# Patient Record
Sex: Male | Born: 1945 | Race: White | Hispanic: No | Marital: Married | Smoking: Former smoker
Health system: Southern US, Community
[De-identification: ages and names within clinical notes are randomized; demographics above are authoritative.]

## PROBLEM LIST (undated history)

## (undated) DIAGNOSIS — I2510 Atherosclerotic heart disease of native coronary artery without angina pectoris: Secondary | ICD-10-CM

## (undated) DIAGNOSIS — I10 Essential (primary) hypertension: Secondary | ICD-10-CM

## (undated) DIAGNOSIS — R001 Bradycardia, unspecified: Secondary | ICD-10-CM

---

## 2011-08-05 DIAGNOSIS — I2510 Atherosclerotic heart disease of native coronary artery without angina pectoris: Secondary | ICD-10-CM

## 2011-08-07 HISTORY — PX: CORONARY ARTERY BYPASS GRAFT: SHX141

## 2011-08-09 DIAGNOSIS — E1165 Type 2 diabetes mellitus with hyperglycemia: Secondary | ICD-10-CM

## 2011-08-09 DIAGNOSIS — IMO0001 Reserved for inherently not codable concepts without codable children: Secondary | ICD-10-CM

## 2011-09-18 ENCOUNTER — Encounter: Payer: Self-pay | Admitting: Cardiothoracic Surgery

## 2011-09-18 ENCOUNTER — Ambulatory Visit (INDEPENDENT_AMBULATORY_CARE_PROVIDER_SITE_OTHER): Payer: Self-pay | Admitting: Physician Assistant

## 2011-09-18 VITALS — BP 124/86 | HR 84 | Resp 18

## 2011-09-18 DIAGNOSIS — Z951 Presence of aortocoronary bypass graft: Secondary | ICD-10-CM

## 2011-09-18 NOTE — Progress Notes (Signed)
Samuel Houston return for scheduled followup after four-vessel coronary bypass grafting on 08/07/2011. His postoperative course was uneventful. He is continuing to make satisfactory recovery after his discharge home. He is a newly diagnosed diabetic and has been taking metformin 1 g twice daily. He didn't close watch on his glucose at home and his glucoses ranging around 140-160. He denies any problems since his discharge home. He denies any chest pain or dyspnea. 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 He is pleased with his progress. His medications are reviewed and include aspirin 81 mg by mouth daily Lipitor 40 mg by mouth bedtime Lopressor 12.5 mg by mouth twice a day metformin 1000 mg by mouth twice a day and lisinopril 2.5 mg by mouth daily.  On exam his blood pressure is 124/86 his pulse is 84 respirations are 18. His heart is in a regular rate and rhythm. His breath sounds are clear to auscultation. The sternum is stable. He has no peripheral edema. Sutures are removed from the chest tube sites as well as the saphenous harvest site was left leg.  Sternal cautions are reviewed. Samuel Houston understands he may return to his regular work activities without limitations about 6 weeks from now. No further followup is scheduled in our office but he understands he may contact us at any time should any problems arise related to his surgery.

## 2011-10-05 ENCOUNTER — Other Ambulatory Visit: Payer: Self-pay | Admitting: Surgical

## 2012-07-20 ENCOUNTER — Other Ambulatory Visit: Payer: Self-pay | Admitting: Surgical

## 2013-07-02 DIAGNOSIS — E1159 Type 2 diabetes mellitus with other circulatory complications: Secondary | ICD-10-CM | POA: Insufficient documentation

## 2013-12-17 DIAGNOSIS — I10 Essential (primary) hypertension: Secondary | ICD-10-CM | POA: Insufficient documentation

## 2013-12-17 DIAGNOSIS — E785 Hyperlipidemia, unspecified: Secondary | ICD-10-CM | POA: Insufficient documentation

## 2015-09-11 DIAGNOSIS — I2510 Atherosclerotic heart disease of native coronary artery without angina pectoris: Secondary | ICD-10-CM | POA: Diagnosis present

## 2015-09-11 DIAGNOSIS — Z951 Presence of aortocoronary bypass graft: Secondary | ICD-10-CM | POA: Insufficient documentation

## 2015-09-12 DIAGNOSIS — D509 Iron deficiency anemia, unspecified: Secondary | ICD-10-CM | POA: Insufficient documentation

## 2015-09-12 DIAGNOSIS — I252 Old myocardial infarction: Secondary | ICD-10-CM | POA: Insufficient documentation

## 2016-04-25 ENCOUNTER — Inpatient Hospital Stay (HOSPITAL_COMMUNITY)
Admission: EM | Admit: 2016-04-25 | Discharge: 2016-04-27 | DRG: 244 | Disposition: A | Discharge status: Home / Self Care | Payer: Medicare HMO | Attending: Cardiovascular Disease | Admitting: Cardiovascular Disease

## 2016-04-25 ENCOUNTER — Inpatient Hospital Stay (HOSPITAL_COMMUNITY): Payer: Medicare HMO

## 2016-04-25 ENCOUNTER — Encounter (HOSPITAL_COMMUNITY): Payer: Self-pay | Admitting: Emergency Medicine

## 2016-04-25 ENCOUNTER — Emergency Department (HOSPITAL_COMMUNITY): Payer: Medicare HMO

## 2016-04-25 DIAGNOSIS — Z95818 Presence of other cardiac implants and grafts: Secondary | ICD-10-CM

## 2016-04-25 DIAGNOSIS — R55 Syncope and collapse: Secondary | ICD-10-CM | POA: Diagnosis present

## 2016-04-25 DIAGNOSIS — Z6827 Body mass index (BMI) 27.0-27.9, adult: Secondary | ICD-10-CM

## 2016-04-25 DIAGNOSIS — Z881 Allergy status to other antibiotic agents status: Secondary | ICD-10-CM

## 2016-04-25 DIAGNOSIS — R42 Dizziness and giddiness: Secondary | ICD-10-CM

## 2016-04-25 DIAGNOSIS — Z79899 Other long term (current) drug therapy: Secondary | ICD-10-CM

## 2016-04-25 DIAGNOSIS — Z951 Presence of aortocoronary bypass graft: Secondary | ICD-10-CM | POA: Diagnosis not present

## 2016-04-25 DIAGNOSIS — Z87891 Personal history of nicotine dependence: Secondary | ICD-10-CM

## 2016-04-25 DIAGNOSIS — Z888 Allergy status to other drugs, medicaments and biological substances status: Secondary | ICD-10-CM | POA: Diagnosis not present

## 2016-04-25 DIAGNOSIS — I252 Old myocardial infarction: Secondary | ICD-10-CM | POA: Diagnosis not present

## 2016-04-25 DIAGNOSIS — R4781 Slurred speech: Secondary | ICD-10-CM | POA: Diagnosis present

## 2016-04-25 DIAGNOSIS — I2510 Atherosclerotic heart disease of native coronary artery without angina pectoris: Secondary | ICD-10-CM | POA: Diagnosis present

## 2016-04-25 DIAGNOSIS — Z7982 Long term (current) use of aspirin: Secondary | ICD-10-CM | POA: Diagnosis not present

## 2016-04-25 DIAGNOSIS — I25111 Atherosclerotic heart disease of native coronary artery with angina pectoris with documented spasm: Secondary | ICD-10-CM

## 2016-04-25 DIAGNOSIS — E785 Hyperlipidemia, unspecified: Secondary | ICD-10-CM | POA: Diagnosis present

## 2016-04-25 DIAGNOSIS — I6521 Occlusion and stenosis of right carotid artery: Secondary | ICD-10-CM | POA: Diagnosis present

## 2016-04-25 DIAGNOSIS — I495 Sick sinus syndrome: Secondary | ICD-10-CM | POA: Diagnosis not present

## 2016-04-25 DIAGNOSIS — I25118 Atherosclerotic heart disease of native coronary artery with other forms of angina pectoris: Secondary | ICD-10-CM | POA: Diagnosis present

## 2016-04-25 DIAGNOSIS — R51 Headache: Secondary | ICD-10-CM | POA: Diagnosis present

## 2016-04-25 DIAGNOSIS — I10 Essential (primary) hypertension: Secondary | ICD-10-CM | POA: Diagnosis present

## 2016-04-25 DIAGNOSIS — R001 Bradycardia, unspecified: Secondary | ICD-10-CM | POA: Diagnosis present

## 2016-04-25 DIAGNOSIS — G459 Transient cerebral ischemic attack, unspecified: Secondary | ICD-10-CM | POA: Diagnosis not present

## 2016-04-25 DIAGNOSIS — I455 Other specified heart block: Secondary | ICD-10-CM | POA: Diagnosis present

## 2016-04-25 DIAGNOSIS — E119 Type 2 diabetes mellitus without complications: Secondary | ICD-10-CM | POA: Diagnosis present

## 2016-04-25 DIAGNOSIS — Z7984 Long term (current) use of oral hypoglycemic drugs: Secondary | ICD-10-CM | POA: Diagnosis not present

## 2016-04-25 DIAGNOSIS — Z794 Long term (current) use of insulin: Secondary | ICD-10-CM

## 2016-04-25 DIAGNOSIS — I4510 Unspecified right bundle-branch block: Secondary | ICD-10-CM | POA: Diagnosis present

## 2016-04-25 DIAGNOSIS — I6789 Other cerebrovascular disease: Secondary | ICD-10-CM | POA: Diagnosis not present

## 2016-04-25 DIAGNOSIS — E1159 Type 2 diabetes mellitus with other circulatory complications: Secondary | ICD-10-CM

## 2016-04-25 DIAGNOSIS — D508 Other iron deficiency anemias: Secondary | ICD-10-CM | POA: Diagnosis present

## 2016-04-25 HISTORY — DX: Bradycardia, unspecified: R00.1

## 2016-04-25 HISTORY — DX: Essential (primary) hypertension: I10

## 2016-04-25 HISTORY — DX: Atherosclerotic heart disease of native coronary artery without angina pectoris: I25.10

## 2016-04-25 LAB — PROTIME-INR
INR: 1.07
PROTHROMBIN TIME: 14 s (ref 11.4–15.2)

## 2016-04-25 LAB — RAPID URINE DRUG SCREEN, HOSP PERFORMED
AMPHETAMINES: NOT DETECTED
BARBITURATES: NOT DETECTED
Benzodiazepines: NOT DETECTED
COCAINE: NOT DETECTED
OPIATES: NOT DETECTED
TETRAHYDROCANNABINOL: NOT DETECTED

## 2016-04-25 LAB — DIFFERENTIAL
BASOS ABS: 0 10*3/uL (ref 0.0–0.1)
Basophils Relative: 0 %
EOS PCT: 1 %
Eosinophils Absolute: 0.1 10*3/uL (ref 0.0–0.7)
LYMPHS ABS: 1.8 10*3/uL (ref 0.7–4.0)
LYMPHS PCT: 25 %
Monocytes Absolute: 0.8 10*3/uL (ref 0.1–1.0)
Monocytes Relative: 11 %
NEUTROS PCT: 63 %
Neutro Abs: 4.6 10*3/uL (ref 1.7–7.7)

## 2016-04-25 LAB — CBC
HCT: 36.8 % — ABNORMAL LOW (ref 39.0–52.0)
HEMOGLOBIN: 11.8 g/dL — AB (ref 13.0–17.0)
MCH: 28.5 pg (ref 26.0–34.0)
MCHC: 32.1 g/dL (ref 30.0–36.0)
MCV: 88.9 fL (ref 78.0–100.0)
PLATELETS: 127 10*3/uL — AB (ref 150–400)
RBC: 4.14 MIL/uL — AB (ref 4.22–5.81)
RDW: 14.3 % (ref 11.5–15.5)
WBC: 7.3 10*3/uL (ref 4.0–10.5)

## 2016-04-25 LAB — COMPREHENSIVE METABOLIC PANEL
ALBUMIN: 3.7 g/dL (ref 3.5–5.0)
ALK PHOS: 21 U/L — AB (ref 38–126)
ALT: 57 U/L (ref 17–63)
ANION GAP: 12 (ref 5–15)
AST: 39 U/L (ref 15–41)
BUN: 16 mg/dL (ref 6–20)
CO2: 21 mmol/L — AB (ref 22–32)
Calcium: 9.3 mg/dL (ref 8.9–10.3)
Chloride: 102 mmol/L (ref 101–111)
Creatinine, Ser: 1.27 mg/dL — ABNORMAL HIGH (ref 0.61–1.24)
GFR calc Af Amer: >60 mL/min (ref >60)
GFR calc non Af Amer: 55 mL/min — ABNORMAL LOW (ref >60)
GLUCOSE: 280 mg/dL — AB (ref 65–99)
POTASSIUM: 4.4 mmol/L (ref 3.5–5.1)
SODIUM: 135 mmol/L (ref 135–145)
Total Bilirubin: 0.7 mg/dL (ref 0.3–1.2)
Total Protein: 6.7 g/dL (ref 6.5–8.1)

## 2016-04-25 LAB — URINALYSIS, ROUTINE W REFLEX MICROSCOPIC
BILIRUBIN URINE: NEGATIVE
HGB URINE DIPSTICK: NEGATIVE
KETONES UR: NEGATIVE mg/dL
Leukocytes, UA: NEGATIVE
Nitrite: NEGATIVE
PH: 6 (ref 5.0–8.0)
Protein, ur: 30 mg/dL — AB
Specific Gravity, Urine: 1.023 (ref 1.005–1.030)

## 2016-04-25 LAB — I-STAT CHEM 8, ED
BUN: 18 mg/dL (ref 6–20)
CHLORIDE: 102 mmol/L (ref 101–111)
CREATININE: 1.1 mg/dL (ref 0.61–1.24)
Calcium, Ion: 1.12 mmol/L (ref 1.12–1.23)
Glucose, Bld: 278 mg/dL — ABNORMAL HIGH (ref 65–99)
HEMATOCRIT: 37 % — AB (ref 39.0–52.0)
Hemoglobin: 12.6 g/dL — ABNORMAL LOW (ref 13.0–17.0)
POTASSIUM: 4.4 mmol/L (ref 3.5–5.1)
SODIUM: 135 mmol/L (ref 135–145)
TCO2: 23 mmol/L (ref 0–100)

## 2016-04-25 LAB — URINE MICROSCOPIC-ADD ON: RBC / HPF: NONE SEEN RBC/hpf (ref 0–5)

## 2016-04-25 LAB — CBG MONITORING, ED
GLUCOSE-CAPILLARY: 187 mg/dL — AB (ref 65–99)
Glucose-Capillary: 268 mg/dL — ABNORMAL HIGH (ref 65–99)

## 2016-04-25 LAB — I-STAT TROPONIN, ED: Troponin i, poc: 0 ng/mL (ref 0.00–0.08)

## 2016-04-25 LAB — APTT: APTT: 26 s (ref 24–36)

## 2016-04-25 LAB — TROPONIN I
Troponin I: <0.03 ng/mL (ref <0.03)
Troponin I: <0.03 ng/mL (ref <0.03)
Troponin I: <0.03 ng/mL (ref <0.03)

## 2016-04-25 LAB — ETHANOL: Alcohol, Ethyl (B): <5 mg/dL (ref <5)

## 2016-04-25 LAB — MRSA PCR SCREENING: MRSA BY PCR: POSITIVE — AB

## 2016-04-25 LAB — GLUCOSE, CAPILLARY
Glucose-Capillary: 269 mg/dL — ABNORMAL HIGH (ref 65–99)
Glucose-Capillary: 268 mg/dL — ABNORMAL HIGH (ref 65–99)

## 2016-04-25 MED ORDER — METOPROLOL TARTRATE 25 MG PO TABS
25.0000 mg | ORAL_TABLET | Freq: Two times a day (BID) | ORAL | Status: DC
  Administered 2016-04-25: 25 mg via ORAL
  Filled 2016-04-25: qty 1

## 2016-04-25 MED ORDER — CHLORHEXIDINE GLUCONATE 0.12 % MT SOLN
15.0000 mL | Freq: Two times a day (BID) | OROMUCOSAL | Status: DC
  Administered 2016-04-26 – 2016-04-27 (×4): 15 mL via OROMUCOSAL
  Filled 2016-04-25 (×4): qty 15

## 2016-04-25 MED ORDER — STROKE: EARLY STAGES OF RECOVERY BOOK
Freq: Once | Status: DC
  Filled 2016-04-25: qty 1

## 2016-04-25 MED ORDER — LISINOPRIL 2.5 MG PO TABS
2.5000 mg | ORAL_TABLET | Freq: Every day | ORAL | Status: DC
  Administered 2016-04-26 – 2016-04-27 (×2): 2.5 mg via ORAL
  Filled 2016-04-25 (×2): qty 1

## 2016-04-25 MED ORDER — PANTOPRAZOLE SODIUM 40 MG PO TBEC
40.0000 mg | DELAYED_RELEASE_TABLET | Freq: Every day | ORAL | Status: DC
  Administered 2016-04-25 – 2016-04-27 (×3): 40 mg via ORAL
  Filled 2016-04-25 (×3): qty 1

## 2016-04-25 MED ORDER — ENOXAPARIN SODIUM 40 MG/0.4ML ~~LOC~~ SOLN
40.0000 mg | SUBCUTANEOUS | Status: DC
  Administered 2016-04-25 – 2016-04-26 (×2): 40 mg via SUBCUTANEOUS
  Filled 2016-04-25 (×2): qty 0.4

## 2016-04-25 MED ORDER — ASPIRIN EC 81 MG PO TBEC
81.0000 mg | DELAYED_RELEASE_TABLET | Freq: Every day | ORAL | Status: DC
  Administered 2016-04-25 – 2016-04-27 (×3): 81 mg via ORAL
  Filled 2016-04-25 (×3): qty 1

## 2016-04-25 MED ORDER — CHLORHEXIDINE GLUCONATE CLOTH 2 % EX PADS
6.0000 | MEDICATED_PAD | Freq: Every day | CUTANEOUS | Status: DC
  Administered 2016-04-26: 6 via TOPICAL

## 2016-04-25 MED ORDER — MUPIROCIN 2 % EX OINT
1.0000 "application " | TOPICAL_OINTMENT | Freq: Two times a day (BID) | CUTANEOUS | Status: DC
  Administered 2016-04-26 – 2016-04-27 (×4): 1 via NASAL
  Filled 2016-04-25: qty 22

## 2016-04-25 MED ORDER — CETYLPYRIDINIUM CHLORIDE 0.05 % MT LIQD
7.0000 mL | Freq: Two times a day (BID) | OROMUCOSAL | Status: DC
  Administered 2016-04-26: 7 mL via OROMUCOSAL

## 2016-04-25 MED ORDER — INSULIN ASPART 100 UNIT/ML ~~LOC~~ SOLN
0.0000 [IU] | Freq: Three times a day (TID) | SUBCUTANEOUS | Status: DC
  Administered 2016-04-25: 3 [IU] via SUBCUTANEOUS
  Administered 2016-04-26: 8 [IU] via SUBCUTANEOUS
  Administered 2016-04-26: 5 [IU] via SUBCUTANEOUS
  Filled 2016-04-25: qty 1

## 2016-04-25 MED ORDER — FERROUS SULFATE 325 (65 FE) MG PO TABS
325.0000 mg | ORAL_TABLET | Freq: Every day | ORAL | Status: DC
  Administered 2016-04-25 – 2016-04-27 (×3): 325 mg via ORAL
  Filled 2016-04-25 (×3): qty 1

## 2016-04-25 NOTE — Progress Notes (Addendum)
I spoke with Dr. Elease HashimotoNahser [Cardiology] about this patient. He had an episode of asystole in the ED and will be transferred to stepdown for closer monitoring. We will continue to follow the patient for his non-cardiac conditions and appreciate Cardiology's assistance.

## 2016-04-25 NOTE — ED Provider Notes (Signed)
I saw and evaluated the patient, reviewed the resident's note and I agree with the findings and plan.  CRITICAL CARE Performed by: Marily MemosMesner, Ibn Stief Total critical care time: 35 minutes Critical care time was exclusive of separately billable procedures and treating other patients. Critical care was necessary to treat or prevent imminent or life-threatening deterioration. Critical care was time spent personally by me on the following activities: development of treatment plan with patient and/or surrogate as well as nursing, discussions with consultants, evaluation of patient's response to treatment, examination of patient, obtaining history from patient or surrogate, ordering and performing treatments and interventions, ordering and review of laboratory studies, ordering and review of radiographic studies, pulse oximetry and re-evaluation of patient's condition.  70 yo M here with near syncopal episode around 1.5 hours PTA and persistent left arm symptoms and slurred speech afterwards. EMS called and patients symptoms still present but slowly improved while with them. On exam here, no neuro deficits, however code stroke already activated. tPA not given 2/2 resolving symptoms. CT negative.  Likely needs MRI and further syncope workup so will admit for continued workup.    EKG Interpretation  Date/Time:  Thursday April 25 2016 09:14:09 EDT Ventricular Rate:  68 PR Interval:    QRS Duration: 109 QT Interval:  444 QTC Calculation: 473 R Axis:   61 Text Interpretation:  Sinus rhythm Borderline T abnormalities, anterior leads Confirmed by [facility] MD, Raylene Carmickle [phone]) on 04/25/2016 9:47:33 AM         Marily MemosJason Socrates Cahoon, MD 04/25/16 1549

## 2016-04-25 NOTE — ED Triage Notes (Signed)
Pt to ER BIB [facility] from home where patient activated EMS for onset of dizziness, shortness of breath, and diaphoresis while standing in the restroom. EMS arrived found patient to be pale and diaphoretic. While transporting patient from house to truck patient began complaining of sudden onset headache and slurred speech. EMS activated code stroke at that time, LSN 0825. CBG 301, hx of DM. Pt has hx of CABG and HTN. EKG per EMS unremarkable. Was given 4 mg zofran in route for nausea. On arrival patient cleared at bridge by neurologist and EDP for CT. NIH at present time score of 1 due to mild deficit in sensory perception to left arm. Pt is a/o x4.

## 2016-04-25 NOTE — ED Notes (Signed)
Pt and made aware of bed assignment

## 2016-04-25 NOTE — ED Provider Notes (Signed)
[facility]-EMERGENCY DEPT Provider Note   CSN: [id number] Arrival date & time: 04/25/16  4098   An emergency department physician performed an initial assessment on this suspected stroke patient at 71.  History   Chief Complaint Chief Complaint  Patient presents with  . Code Stroke    HPI Samuel Houston is a 70 y.o. male.  HPI  Patient presenting after episode of diaphoresis and disorientation. Code Stroke called.   About 1.5 hours prior to ED presentation, patient was having a bowel movement when he began to feel diaphoretic, nauseated, and disoriented. His wife became concerned and called 911. He was able to ambulate from the bathroom to a chair while waiting for EMS. Patient reports that his symptoms passed within a few minutes, however he thinks he may have had slurred speech initially. This has now resolved. He denies dizziness, numbness, weakness. Does endorse generalized headache that he rates 5/10. No photophobia, changes in vision.  Patient with significant cardiac history, including reported quadruple bypass in 2012. Does not see a PCP or cardiologist regularly. His medications are refilled at an Urgent Care center.   Past Medical History:  Diagnosis Date  . Diabetes mellitus   . Hypertension     There are no active problems to display for this patient.   Past Surgical History:  Procedure Laterality Date  . CORONARY ARTERY BYPASS GRAFT  08-07-11    Home Medications    Prior to Admission medications   Medication Sig Start Date End Date Taking? Authorizing Provider  aspirin 81 MG tablet Take 81 mg by mouth daily.   Yes Historical Provider, MD  DOCOSAHEXAENOIC ACID PO Take 2 capsules by mouth daily. 12/06/15  Yes Historical Provider, MD  fenofibrate 160 MG tablet Take 160 mg by mouth daily. 02/29/16  Yes Historical Provider, MD  ferrous sulfate 325 (65 FE) MG EC tablet Take 325 mg by mouth daily.   Yes Historical Provider, MD  folic acid (FOLVITE) 1 MG tablet  Take 1 mg by mouth daily.   Yes Historical Provider, MD  glipiZIDE (GLUCOTROL) 10 MG tablet Take 10 mg by mouth daily. 02/29/16  Yes Historical Provider, MD  ibuprofen (ADVIL,MOTRIN) 200 MG tablet Take 200 mg by mouth every 6 (six) hours as needed for moderate pain.   Yes Historical Provider, MD  lisinopril (PRINIVIL,ZESTRIL) 2.5 MG tablet Take 2.5 mg by mouth daily.    Yes Historical Provider, MD  metFORMIN (GLUCOPHAGE) 1000 MG tablet Take 1,000 mg by mouth 2 (two) times daily. 02/29/16  Yes Historical Provider, MD  metoprolol tartrate (LOPRESSOR) 25 MG tablet Take 25 mg by mouth 2 (two) times daily. 04/10/16 07/09/16 Yes Historical Provider, MD  Multiple Vitamins-Minerals (ZINC PO) Take 1 tablet by mouth daily.   Yes Historical Provider, MD  omeprazole (PRILOSEC) 40 MG capsule Take 40 mg by mouth daily. 09/08/15  Yes Historical Provider, MD  saw palmetto 80 MG capsule Take 80 mg by mouth 2 (two) times daily.   Yes Historical Provider, MD  tamsulosin (FLOMAX) 0.4 MG CAPS capsule Take 0.4 mg by mouth daily. 03/13/16  Yes Historical Provider, MD    Family History History reviewed. No pertinent family history.  Social History Social History  Substance Use Topics  . Smoking status: Former Smoker    Packs/day: 3.00    Types: Cigarettes    Quit date: 08/02/2004  . Smokeless tobacco: Never Used  . Alcohol use No    Allergies   Ascorbate and Penicillins  Review of Systems Review  of Systems  Constitutional: Negative for chills and fever.  Eyes: Negative for photophobia and visual disturbance.  Respiratory: Negative for shortness of breath.   Cardiovascular: Negative for chest pain.  Gastrointestinal: Positive for nausea. Negative for abdominal pain and vomiting.  Allergic/Immunologic: Negative for immunocompromised state.  Neurological: Positive for speech difficulty and headaches. Negative for dizziness, syncope and weakness.  Psychiatric/Behavioral: Negative for confusion and decreased  concentration.   Physical Exam Updated Vital Signs BP 150/91 (BP Location: Left Arm)   Pulse 73   Temp 97.6 F (36.4 C)   Resp 16   Ht 5\' 8"  (1.727 m)   Wt 82.5 kg   SpO2 100%   BMI 27.65 kg/m   Physical Exam  Constitutional: He is oriented to person, place, and time. He appears well-developed and well-nourished. No distress.  HENT:  Head: Normocephalic and atraumatic.  Nose: Nose normal.  Mouth/Throat: Oropharynx is clear and moist. No oropharyngeal exudate.  Eyes: Conjunctivae and EOM are normal. Pupils are equal, round, and reactive to light. Right eye exhibits no discharge. Left eye exhibits no discharge. No scleral icterus.  Cardiovascular: Normal rate, regular rhythm and normal heart sounds.   No murmur heard. Pulmonary/Chest: Effort normal and breath sounds normal. No respiratory distress. He has no wheezes.  Abdominal: Soft. Bowel sounds are normal. He exhibits no distension. There is no tenderness.  Neurological: He is alert and oriented to person, place, and time.  Reports slightly decreased sensation to light touch on L side of face. Reports equal sensation on extremities. 5/5 strength upper and lower extremities. No difficulty with finger to nose bilaterally. CN II-XII grossly intact. No slurring of speech.   Skin: Skin is warm and dry. He is not diaphoretic.  Psychiatric: He has a normal mood and affect. His behavior is normal.   ED Treatments / Results  Labs (all labs ordered are listed, but only abnormal results are displayed) Labs Reviewed  CBC - Abnormal; Notable for the following:       Result Value   RBC 4.14 (*)    Hemoglobin 11.8 (*)    HCT 36.8 (*)    Platelets 127 (*)    All other components within normal limits  COMPREHENSIVE METABOLIC PANEL - Abnormal; Notable for the following:    CO2 21 (*)    Glucose, Bld 280 (*)    Creatinine, Ser 1.27 (*)    Alkaline Phosphatase 21 (*)    GFR calc non Af Amer 55 (*)    All other components within normal  limits  I-STAT CHEM 8, ED - Abnormal; Notable for the following:    Glucose, Bld 278 (*)    Hemoglobin 12.6 (*)    HCT 37.0 (*)    All other components within normal limits  CBG MONITORING, ED - Abnormal; Notable for the following:    Glucose-Capillary 268 (*)    All other components within normal limits  ETHANOL  PROTIME-INR  APTT  DIFFERENTIAL  URINE RAPID DRUG SCREEN, HOSP PERFORMED  URINALYSIS, ROUTINE W REFLEX MICROSCOPIC (NOT AT [facility])  I-STAT TROPOININ, ED    EKG  EKG Interpretation  Date/Time:  Thursday April 25 2016 09:14:09 EDT Ventricular Rate:  68 PR Interval:    QRS Duration: 109 QT Interval:  444 QTC Calculation: 473 R Axis:   61 Text Interpretation:  Sinus rhythm Borderline T abnormalities, anterior leads Confirmed by [facility] MD, Barbara Cower [id number]) on 04/25/2016 9:47:33 AM       Radiology Ct Head Code Stroke W/o  Cm  Result Date: 04/25/2016 CLINICAL DATA:  Code stroke. 70 year old male with slurred speech and headache. Initial encounter. EXAM: CT HEAD WITHOUT CONTRAST TECHNIQUE: Contiguous axial images were obtained from the base of the skull through the vertex without intravenous contrast. COMPARISON:  Neck CT 09/21/2015. FINDINGS: Visualized paranasal sinuses and mastoids are stable and well pneumatized. No acute osseous abnormality identified. Visualized orbits and scalp soft tissues are within normal limits. Calcified atherosclerosis at the skull base. No suspicious intracranial vascular hyperdensity. No cortically based acute infarct identified. ASPECTS [facility] Stroke Program Early CT Score) Total score (0-10 with 10 being normal): 10 Chronic appearing right basal ganglia lacunar infarct is new since January. Associated right anterior frontal lobe white matter hypodensity about the frontal horn. Elsewhere gray-white matter differentiation is within normal limits. No acute intracranial hemorrhage identified. IMPRESSION: 1. No acute cortically based infarct or acute  intracranial hemorrhage identified. 2. New lacunar infarct of the right basal ganglia since a neck CT in January is age indeterminate but favored to be chronic. 3. ASPECTS is 10. 4. The above information was relayed via text page to Dr. Rose FillersJames Armstrong at [phone] hours. Electronically Signed   By: Odessa FlemingH  Hall M.D.   On: 04/25/2016 09:22    Procedures Procedures (including critical care time)  Medications Ordered in ED Medications - No data to display   Initial Impression / Assessment and Plan / ED Course  I have reviewed the triage vital signs and the nursing notes.  Pertinent labs & imaging results that were available during my care of the patient were reviewed by me and considered in my medical decision making (see chart for details).  Clinical Course   -Neuro consulted given code stroke. CT with no obvious acute abnormalities, though lacunar stroke is noted that was not seen on last CT in January. Appears more chronic in appearance however. Question of TIA vs vasovagal incident, however given patient's age and cardiac history, likely appropriate for observation overnight. Will await neurology recommendations.   - Per neuro, MRI brain indicated. Will order and consult internal medicine service regarding admission for continued work-up.    Final Clinical Impressions(s) / ED Diagnoses   Final diagnoses:  Syncope   Patient presenting after episode of diaphoresis, disorientation, and slurred speech. Code stroke called. No acute findings on CT head, but per neuro recommendations, patient needs continued work-up. Consulted internal medicine service - will admit.  New Prescriptions New Prescriptions   No medications on file     Marquette SaaAbigail Joseph Makylie Rivere, MD 04/25/16 1059    Marily MemosJason Mesner, MD 04/25/16 1431

## 2016-04-25 NOTE — Consult Note (Signed)
Requesting Physician: Dr. Clayborne Danamesner    Chief Complaint: syncope with intermittent slurred speech  History obtained from:  Patient    HPI:                                                                                                                                         Alfredo BattyKenneth Edward Fotopoulos is an 70 y.o. male Presenting to hospital after suddenly feeling hot, diaphoretic and then started to lean down to the tub but unsure if he passed out. In route he had intermittent slurred speech which has fully resolved. Currently he is asymptomatic. No acute infarct on CT head.   Date last known well: Date: 04/25/2016 Time last known well: Time: 08:25 tPA Given: No: symptoms resolved.    Past Medical History:  Diagnosis Date  . Diabetes mellitus   . Hypertension     Past Surgical History:  Procedure Laterality Date  . CORONARY ARTERY BYPASS GRAFT  08-07-11    History reviewed. No pertinent family history. Social History:  reports that he quit smoking about 11 years ago. His smoking use included Cigarettes. He smoked 3.00 packs per day. He has never used smokeless tobacco. He reports that he does not drink alcohol or use drugs.  Allergies:  Allergies  Allergen Reactions  . Penicillins     Penicillins results in rash    Medications:                                                                                                                           No current facility-administered medications for this encounter.    Current Outpatient Prescriptions  Medication Sig Dispense Refill  . aspirin 81 MG tablet Take 81 mg by mouth daily.    Marland Kitchen. atorvastatin (LIPITOR) 80 MG tablet Take 80 mg by mouth at bedtime.    Marland Kitchen. lisinopril (PRINIVIL,ZESTRIL) 2.5 MG tablet Take 2.5 mg by mouth.    . metFORMIN (GLUCOPHAGE) 500 MG tablet Take 500 mg by mouth 2 (two) times daily.    . Metoprolol Tartrate (LOPRESSOR PO) Take 12.5 mg by mouth 2 (two) times daily.    . TraMADol HCl (ULTRAM PO) Take 50 mg by  mouth every 4 (four) hours as needed.       ROS:  History obtained from the patient  General ROS: negative for - chills, fatigue, fever, night sweats, weight gain or weight loss Psychological ROS: negative for - behavioral disorder, hallucinations, memory difficulties, mood swings or suicidal ideation Ophthalmic ROS: negative for - blurry vision, double vision, eye pain or loss of vision ENT ROS: negative for - epistaxis, nasal discharge, oral lesions, sore throat, tinnitus or vertigo Allergy and Immunology ROS: negative for - hives or itchy/watery eyes Hematological and Lymphatic ROS: negative for - bleeding problems, bruising or swollen lymph nodes Endocrine ROS: negative for - galactorrhea, hair pattern changes, polydipsia/polyuria or temperature intolerance Respiratory ROS: negative for - cough, hemoptysis, shortness of breath or wheezing Cardiovascular ROS: negative for - chest pain, dyspnea on exertion, edema or irregular heartbeat Gastrointestinal ROS: negative for - abdominal pain, diarrhea, hematemesis, nausea/vomiting or stool incontinence Genito-Urinary ROS: negative for - dysuria, hematuria, incontinence or urinary frequency/urgency Musculoskeletal ROS: negative for - joint swelling or muscular weakness Neurological ROS: as noted in HPI Dermatological ROS: negative for rash and skin lesion changes  Neurologic Examination:                                                                                                      Blood pressure 150/91, pulse 73, temperature 97.6 F (36.4 C), temperature source Oral, resp. rate 16, height 5\' 8"  (1.727 m), weight 82.5 kg (181 lb 14.1 oz), SpO2 100 %.  HEENT-  Normocephalic, no lesions, without obvious abnormality.  Normal external eye and conjunctiva.  Normal TM's bilaterally.  Normal auditory canals and  external ears. Normal external nose, mucus membranes and septum.  Normal pharynx. Cardiovascular- S1, S2 normal, pulses palpable throughout   Lungs- chest clear, no wheezing, rales, normal symmetric air entry Abdomen- normal findings: bowel sounds normal Extremities- no edema Lymph-no adenopathy palpable Musculoskeletal-no joint tenderness, deformity or swelling Skin-warm and dry, no hyperpigmentation, vitiligo, or suspicious lesions  Neurological Examination Mental Status: Alert, oriented, thought content appropriate.  Speech fluent without evidence of aphasia.  Able to follow 3 step commands without difficulty. Cranial Nerves: II: Discs flat bilaterally; Visual fields grossly normal, pupils equal, round, reactive to light and accommodation III,IV, VI: ptosis not present, extra-ocular motions intact bilaterally V,VII: smile symmetric, facial light touch sensation normal bilaterally VIII: hearing normal bilaterally IX,X: uvula rises symmetrically XI: bilateral shoulder shrug XII: midline tongue extension Motor: Right : Upper extremity   5/5    Left:     Upper extremity   5/5  Lower extremity   5/5     Lower extremity   5/5 Tone and bulk:normal tone throughout; no atrophy noted Sensory: Pinprick and light touch intact throughout, bilaterally Deep Tendon Reflexes: 2+ and symmetric throughout Plantars: Right: downgoing   Left: downgoing Cerebellar: normal finger-to-nose and normal heel-to-shin test Gait: not tested      Lab Results: Basic Metabolic Panel:  Recent Labs Lab 04/25/16 0901 04/25/16 0909  NA 135 135  K 4.4 4.4  CL 102 102  CO2 21*  --   GLUCOSE 280* 278*  BUN 16 18  CREATININE 1.27* 1.10  CALCIUM 9.3  --     Liver Function Tests:  Recent Labs Lab 04/25/16 0901  AST 39  ALT 57  ALKPHOS 21*  BILITOT 0.7  PROT 6.7  ALBUMIN 3.7   No results for input(s): LIPASE, AMYLASE in the last 168 hours. No results for input(s): AMMONIA in the last 168  hours.  CBC:  Recent Labs Lab 04/25/16 0901 04/25/16 0909  WBC 7.3  --   NEUTROABS 4.6  --   HGB 11.8* 12.6*  HCT 36.8* 37.0*  MCV 88.9  --   PLT 127*  --     Cardiac Enzymes: No results for input(s): CKTOTAL, CKMB, CKMBINDEX, TROPONINI in the last 168 hours.  Lipid Panel: No results for input(s): CHOL, TRIG, HDL, CHOLHDL, VLDL, LDLCALC in the last 168 hours.  CBG:  Recent Labs Lab 04/25/16 0916  GLUCAP 268*    Microbiology: No results found for this or any previous visit.  Coagulation Studies:  Recent Labs  04/25/16 0901  LABPROT 14.0  INR 1.07    Imaging: Ct Head Code Stroke W/o Cm  Result Date: 04/25/2016 CLINICAL DATA:  Code stroke. 70 year old male with slurred speech and headache. Initial encounter. EXAM: CT HEAD WITHOUT CONTRAST TECHNIQUE: Contiguous axial images were obtained from the base of the skull through the vertex without intravenous contrast. COMPARISON:  Neck CT 09/21/2015. FINDINGS: Visualized paranasal sinuses and mastoids are stable and well pneumatized. No acute osseous abnormality identified. Visualized orbits and scalp soft tissues are within normal limits. Calcified atherosclerosis at the skull base. No suspicious intracranial vascular hyperdensity. No cortically based acute infarct identified. ASPECTS [facility] Stroke Program Early CT Score) Total score (0-10 with 10 being normal): 10 Chronic appearing right basal ganglia lacunar infarct is new since January. Associated right anterior frontal lobe white matter hypodensity about the frontal horn. Elsewhere gray-white matter differentiation is within normal limits. No acute intracranial hemorrhage identified. IMPRESSION: 1. No acute cortically based infarct or acute intracranial hemorrhage identified. 2. New lacunar infarct of the right basal ganglia since a neck CT in January is age indeterminate but favored to be chronic. 3. ASPECTS is 10. 4. The above information was relayed via text page to Dr.  Rose Fillers at [phone] hours. Electronically Signed   By: Odessa Fleming M.D.   On: 04/25/2016 09:22       Assessment and plan discussed with with attending physician and they are in agreement.    Felicie Morn PA-C [facility] [phone]  04/25/2016, 9:55 AM   Assessment: 70 y.o. male with syncopal event with possible transient slurred speech while in route to hospital. At this time all symptoms have resolved. Given risk factors cannot fully rule out low profusion CVA.  Stroke Risk Factors - diabetes mellitus and hypertension   Recommend: 1) MRI brain--if CVA on MRI continue stroke work up.  2) If no CVA on MRI brain would not further stroke work up.  3) Evaluate for orthostatic hypo tension.    Neurology attending:  Iantha Fallen presented to the hospital with a sensation of feeling diaphoretic as well as lightheaded with some slurred speech. By the time of his arrival here at the emergency room he was asymptomatic. His CT of the brain was normal. Agree with above recommendations.  Rose Fillers, M.D. Neurohospitalist

## 2016-04-25 NOTE — H&P (Signed)
Date: 04/25/2016               Patient Name:  Samuel BattyKenneth Edward Houston MRN: [id number]  DOB: 01/26/1946 Age / Sex: 70 y.o., male   PCP: No primary care provider on file.         Medical Service: Internal Medicine Teaching Service         Attending Physician: Dr. Levert FeinsteinJames M Granfortuna, MD    First Contact: Dr. Carolynn CommentBryan Lynna Zamorano Pager: [phone]  Second Contact: Dr. 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 Pt woke up this morning in his usual state of health to use the restroom, while on the toilet he developed diaphoresis, dizziness, and nausea. He cannot recall if he passed out but recalls leaning over to the side of the tub. His wife place a cold rag on his face and contacted his son who called EMS around 8:00am. He denies constipation or having to strain during his bowel movement this morning, denies any urinary sx. He also noted a headache all over his scalp that he rates at 5/10. Of note he has a hx of an MI and his presenting sx was a rt frontal headache without any chest pain. He was told by his cardiologist that HAs should be his warning sx for future MI precautions. He currently denies any chest pain. He denies blurry vision, decreased po intake, vomiting, diarrhea, fevers, recent illness, and sick contacts. He denies dizziness upon standing from supine position.   In the ED, code stroke was called and CT head was negative.   Meds:  Current Meds  Medication Sig  . aspirin 81 MG tablet Take 81 mg by mouth daily.  . DOCOSAHEXAENOIC ACID PO Take 2 capsules by mouth daily.  . fenofibrate 160 MG tablet Take 160 mg by mouth daily.  . ferrous sulfate 325 (65 FE) MG EC tablet Take 325 mg by mouth daily.  . folic acid (FOLVITE) 1 MG tablet  Take 1 mg by mouth daily.  Marland Kitchen. glipiZIDE (GLUCOTROL) 10 MG tablet Take 10 mg by mouth daily.  Marland Kitchen. ibuprofen (ADVIL,MOTRIN) 200 MG tablet Take 200 mg by mouth every 6 (six) hours as needed for moderate pain.  Marland Kitchen. lisinopril (PRINIVIL,ZESTRIL) 2.5 MG tablet Take 2.5 mg by mouth daily.   . metFORMIN (GLUCOPHAGE) 1000 MG tablet Take 1,000 mg by mouth 2 (two) times daily.  . metoprolol tartrate (LOPRESSOR) 25 MG tablet Take 25 mg by mouth 2 (two) times daily.  . Multiple Vitamins-Minerals (ZINC PO) Take 1 tablet by mouth daily.  Marland Kitchen. omeprazole (PRILOSEC) 40 MG capsule Take 40 mg by mouth daily.  . saw palmetto 80 MG capsule Take 80 mg by mouth 2 (two) times daily.  . tamsulosin (FLOMAX) 0.4 MG CAPS capsule Take 0.4 mg by mouth daily.     Allergies: Allergies as of 04/25/2016 - Review Complete 04/25/2016  Allergen Reaction Noted  . Ascorbate  12/05/2015  . Penicillins  09/18/2011   Past Medical History:  Diagnosis Date  . Diabetes mellitus   . Hypertension     Family History: dad- CABG, brother- heart disease dx age 71. Son- healthy  Social History: Former smoker but quit 15 years ago, denies EtOH use. Lives  with his wife.   Review of Systems: A complete ROS was negative except as per HPI.   Physical Exam: Blood pressure 150/91, pulse 73, temperature 97.6 F (36.4 C), resp. rate 16, height 5\' 8"  (1.727 m), weight 181 lb 14.1 oz (82.5 kg), SpO2 100 %. Physical Exam  Constitutional: He is oriented to person, place, and time. He appears well-developed and well-nourished. No distress.  HENT:  Head: Normocephalic and atraumatic.  Nose: Nose normal.  Cardiovascular: Normal rate and regular rhythm.  Exam reveals no gallop and no friction rub.   No murmur heard. Pulmonary/Chest: Effort normal and breath sounds normal. No respiratory distress. He has no wheezes. He has no rales.  Abdominal: Soft. Bowel sounds are normal. He exhibits no distension and no mass. There is no tenderness. There is no  rebound and no guarding.  Neurological: He is alert and oriented to person, place, and time. No cranial nerve deficit. Coordination normal.  Skin: Skin is warm and dry. No rash noted. He is not diaphoretic. No erythema. No pallor.    EKG Interpretation  Date/Time:  Thursday April 25 2016 09:14:09 EDT Ventricular Rate:  68 PR Interval:    QRS Duration: 109 QT Interval:  444 QTC Calculation: 473 R Axis:   61 Text Interpretation:  Sinus rhythm Borderline T abnormalities, anterior leads Confirmed by [facility] HospitalMESNER MD, JASON [phone]) on 04/25/2016 9:47:33 AM   CT head-- 1. No acute cortically based infarct or acute intracranial hemorrhage identified. 2. New lacunar infarct of the right basal ganglia since a neck CT in January is age indeterminate but favored to be chronic.  Assessment & Plan by Problem: Active Problems:   Dizziness  Dizziness / Syncope-- pt presents with dizziness with associated nausea and diaphoresis while on the toilet having a BM. He cannot say for sure that he passed out. This sounds like vasovagal syndrome. Pt feels back at his baseline currently. He denies poor po intake and orthostatic symptoms. Head CT was negative. Neurology is following and recommend MRI brain to r/o CVA and orthostatic hypotension work up.  - admit to tele - MRI brain - orthostatic vitals ordered  Coronary artery disease-- w/ prior MI/CABG 08/04/11. PCSK9 inhibitor at home as he is intolerant of statins. He [facility]. HA w/o chest pain was his presenting sx when he had a MI.  POC trop negative.  - asa daily - trend troponins x 3 - EKG in the AM - with significant RF including CAD, hx of MI, family hx, hx of tobacco abuse, HLD, HTN, and DM cardiology was consulted as his previous presenting sx when he had a MI in 2012 are similar to this admission's presentation ( HA, diaphoresis), and he was told by his cardiology that HA's are his warning sx.   IDA 2/2 erosive  esophagitis-- follows w/ [facility]. hgb low for a male at 11.8, denies active bleeding or bloody BM. Continue home iron and PPI.  - repeat CBC in the am   HTN-- continue home lisinopril 2.5mg  and lopressor 25mg  BID  DM--a1c 6.9 on 12/2015. holding home metformin and glipizide - SSI  DVTppx- lovenox Diet - hh/carb     Dispo: Admit patient to Inpatient with expected length of stay greater than 2 midnights.  Signed: Denton Brickiana M Truong, MD 04/25/2016, 11:11 AM  Pager: [phone]

## 2016-04-25 NOTE — ED Notes (Signed)
While cardiology at the bedside, patient beginning to feel extremely dizzy. Pt proceeded to brady down and have a 3-4 second pause. MD requesting patient to be admitted under SDU.

## 2016-04-25 NOTE — Consult Note (Signed)
CONSULT NOTE  Date: 04/25/2016               Patient Name:  Samuel BattyKenneth Edward Siegfried MRN: [id number]  DOB: [birth year] Age / Sex: 70 y.o., male        PCP: No primary care provider on file. Primary Cardiologist: Dr. Rhona Leavenshiu [facility] [place]             Referring Physician: Marlena ClipperGrandfortuna              Reason for Consult: Near syncope          History of Present Illness: Patient is a 70 y.o. male with a PMHx of CAD , CABG , who [facility] for evaluation of near syncope  Had an episode of near syncope this am Had a head ache this am.  (this was his presenting angina equivalent with his MI in 2012)  Never has any CP  Was on the toilet,   Developed lightheadness. Was very hot,   Sweaty, was moaning No loss of consciousness, Wife called 911 Developed slurred speech - was thought to be having a stroke, was brought to [facility] [place]   Feels ok now.  Still has a mild headache Breathing is ok No stroke symptoms currently   Works as a [employer], pretty active. Works every day  Does his own yard work .  No recent stress tests   Previous smoker,  Quit for 20+ years + family hx of cardiac problems  + diabetes on both sides of family   Medications: Outpatient medications:  (Not in a hospital admission)  Current medications: Current Facility-Administered Medications  Medication Dose Route Frequency Provider Last Rate Last Dose  .  stroke: mapping our early stages of recovery book   Does not apply Once Denton Brickiana M Truong, MD      . aspirin EC tablet 81 mg  81 mg Oral Daily Denton Brickiana M Truong, MD   81 mg at 04/25/16 1326  . enoxaparin (LOVENOX) injection 40 mg  40 mg Subcutaneous Q24H Denton Brickiana M Truong, MD      . ferrous sulfate EC tablet 325 mg  325 mg Oral Daily Denton Brickiana M Truong, MD      . insulin aspart (novoLOG) injection 0-15 Units  0-15 Units Subcutaneous TID WC Denton Brickiana M Truong, MD      . lisinopril (PRINIVIL,ZESTRIL) tablet 2.5 mg  2.5 mg Oral Daily Denton Brickiana  M Truong, MD      . metoprolol tartrate (LOPRESSOR) tablet 25 mg  25 mg Oral BID Denton Brickiana M Truong, MD   25 mg at 04/25/16 1326  . pantoprazole (PROTONIX) EC tablet 40 mg  40 mg Oral Daily Denton Brickiana M Truong, MD   40 mg at 04/25/16 1325   Current Outpatient Prescriptions  Medication Sig Dispense Refill  . aspirin 81 MG tablet Take 81 mg by mouth daily.    . DOCOSAHEXAENOIC ACID PO Take 2 capsules by mouth daily.    . fenofibrate 160 MG tablet Take 160 mg by mouth daily.    . ferrous sulfate 325 (65 FE) MG EC tablet Take 325 mg by mouth daily.    . folic acid (FOLVITE) 1 MG tablet Take 1 mg by mouth daily.    Marland Kitchen. glipiZIDE (GLUCOTROL) 10 MG tablet Take 10 mg by mouth daily.    Marland Kitchen. ibuprofen (ADVIL,MOTRIN) 200 MG tablet Take 200 mg by mouth every 6 (six) hours as needed for moderate pain.    .Marland Kitchen  lisinopril (PRINIVIL,ZESTRIL) 2.5 MG tablet Take 2.5 mg by mouth daily.     . metFORMIN (GLUCOPHAGE) 1000 MG tablet Take 1,000 mg by mouth 2 (two) times daily.    . metoprolol tartrate (LOPRESSOR) 25 MG tablet Take 25 mg by mouth 2 (two) times daily.    . Multiple Vitamins-Minerals (ZINC PO) Take 1 tablet by mouth daily.    Marland Kitchen omeprazole (PRILOSEC) 40 MG capsule Take 40 mg by mouth daily.    . saw palmetto 80 MG capsule Take 80 mg by mouth 2 (two) times daily.    . tamsulosin (FLOMAX) 0.4 MG CAPS capsule Take 0.4 mg by mouth daily.       Allergies  Allergen Reactions  . Ascorbate     Other reaction(s): Other Causes sore in mouth and aggregates esophagus  . Penicillins     Penicillins results in rash     Past Medical History:  Diagnosis Date  . Diabetes mellitus   . Hypertension     Past Surgical History:  Procedure Laterality Date  . CORONARY ARTERY BYPASS GRAFT  08-07-11    History reviewed. No pertinent family history.  Social History:  reports that he quit smoking about 11 years ago. His smoking use included Cigarettes. He smoked 3.00 packs per day. He has never used smokeless tobacco. He  reports that he does not drink alcohol or use drugs.   Review of Systems: Constitutional:  denies fever, chills, diaphoresis, appetite change and fatigue.  HEENT: denies photophobia, eye pain, redness, hearing loss, ear pain, congestion, sore throat, rhinorrhea, sneezing, neck pain, neck stiffness and tinnitus.  Respiratory: denies SOB, DOE, cough, chest tightness, and wheezing.  Cardiovascular: denies chest pain, palpitations and leg swelling.  Gastrointestinal: denies nausea, vomiting, abdominal pain, diarrhea, constipation, blood in stool.  Genitourinary: denies dysuria, urgency, frequency, hematuria, flank pain and difficulty urinating.  Musculoskeletal: denies  myalgias, back pain, joint swelling, arthralgias and gait problem.   Skin: denies pallor, rash and wound.  Neurological: admits to dizziness,  And presyncope   Hematological: denies adenopathy, easy bruising, personal or family bleeding history.  Psychiatric/ Behavioral: denies suicidal ideation, mood changes, confusion, nervousness, sleep disturbance and agitation.    Physical Exam: BP 153/91   Pulse 90   Temp 97.6 F (36.4 C)   Resp 15   Ht 5\' 8"  (1.727 m)   Wt 181 lb 14.1 oz (82.5 kg)   SpO2 97%   BMI 27.65 kg/m   Wt Readings from Last 3 Encounters:  04/25/16 181 lb 14.1 oz (82.5 kg)  04/25/16 181 lb (82.1 kg)    General: Vital signs reviewed and noted. Well-developed, well-nourished, in no acute distress; alert,   Head: Normocephalic, atraumatic, sclera anicteric,   Neck: Supple. Negative for carotid bruits. No JVD   Lungs:  Clear bilaterally, no  wheezes, rales, or rhonchi. Breathing is normal   Heart: RRR with S1 S2. No murmurs, rubs, or gallops   Abdomen/ GI :  Soft, non-tender, non-distended with normoactive bowel sounds. No hepatomegaly. No rebound/guarding. No obvious abdominal masses   MSK: Strength and the appear normal for age.   Extremities: No clubbing or cyanosis. No edema.  Distal pedal pulses are  2+ and equal   Neurologic:  CN are grossly intact,  No obvious motor or sensory defect.  Alert and oriented X 3. Moves all extremities spontaneously.  Psych: Responds to questions appropriately with a normal affect.     Lab results: Basic Metabolic Panel:  Recent Labs Lab 04/25/16  0901 04/25/16 0909  NA 135 135  K 4.4 4.4  CL 102 102  CO2 21*  --   GLUCOSE 280* 278*  BUN 16 18  CREATININE 1.27* 1.10  CALCIUM 9.3  --     Liver Function Tests:  Recent Labs Lab 04/25/16 0901  AST 39  ALT 57  ALKPHOS 21*  BILITOT 0.7  PROT 6.7  ALBUMIN 3.7   No results for input(s): LIPASE, AMYLASE in the last 168 hours. No results for input(s): AMMONIA in the last 168 hours.  CBC:  Recent Labs Lab 04/25/16 0901 04/25/16 0909  WBC 7.3  --   NEUTROABS 4.6  --   HGB 11.8* 12.6*  HCT 36.8* 37.0*  MCV 88.9  --   PLT 127*  --     Cardiac Enzymes:  Recent Labs Lab 04/25/16 1136  TROPONINI <0.03    BNP: Invalid input(s): POCBNP  CBG:  Recent Labs Lab 04/25/16 0916 04/25/16 1552  GLUCAP 268* 187*    Coagulation Studies:  Recent Labs  04/25/16 0901  LABPROT 14.0  INR 1.07     Other results:  Personal review of EKG shows : NSR, inf. Q waves  Tele - episode of sinus arrest at 1645 , >4 second pause    Imaging: Mr Brain Wo Contrast  Result Date: 04/25/2016 CLINICAL DATA:  Near syncopal episode, with diaphoresis. Slurred speech and headache. Exam now normal. EXAM: MRI HEAD WITHOUT CONTRAST TECHNIQUE: Multiplanar, multiecho pulse sequences of the brain and surrounding structures were obtained without intravenous contrast. COMPARISON:  CT head earlier today. FINDINGS: No evidence for acute infarction, hemorrhage, mass lesion, hydrocephalus, or extra-axial fluid. Mild atrophy, not unexpected for age. Mild subcortical and periventricular T2 and FLAIR hyperintensities, likely chronic microvascular ischemic change. Chronic lenticulostriate lacunar infarct affects  the RIGHT lentiform nucleus and surrounding white matter. Flow voids are maintained throughout the carotid, basilar, and vertebral arteries. There are no areas of chronic hemorrhage. Normal pituitary and cerebellar tonsils. Cervical spondylosis with disc space narrowing, and osseous spurring at the C3-4 level resulting in apparent spinal stenosis. Visualized calvarium, and skull base unremarkable. Scalp and extracranial soft tissues, orbits, sinuses, and mastoids show no acute process. Chronic sinus disease, most notable LEFT maxillary region. IMPRESSION: Atrophy and small vessel disease.  No acute intracranial findings. 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 Within limits for assessment on routine brain MR, no evidence for acute or chronic posterior circulation ischemia. No apparent intracranial vascular stenosis or dissection. Electronically Signed   By: Elsie StainJohn T Curnes M.D.   On: 04/25/2016 15:43   Ct Head Code Stroke W/o Cm  Result Date: 04/25/2016 CLINICAL DATA:  Code stroke. 70 year old male with slurred speech and headache. Initial encounter. EXAM: CT HEAD WITHOUT CONTRAST TECHNIQUE: Contiguous axial images were obtained from the base of the skull through the vertex without intravenous contrast. COMPARISON:  Neck CT 09/21/2015. FINDINGS: Visualized paranasal sinuses and mastoids are stable and well pneumatized. No acute osseous abnormality identified. Visualized orbits and scalp soft tissues are within normal limits. Calcified atherosclerosis at the skull base. No suspicious intracranial vascular hyperdensity. No cortically based acute infarct identified. ASPECTS [facility]([place] Stroke Program Early CT Score) Total score (0-10 with 10 being normal): 10 Chronic appearing right basal ganglia lacunar infarct is new since January. Associated right anterior frontal lobe white matter hypodensity about the frontal horn. Elsewhere gray-white matter differentiation is within normal limits. No  acute intracranial hemorrhage identified. IMPRESSION: 1. No acute cortically based infarct or acute intracranial hemorrhage  identified. 2. New lacunar infarct of the right basal ganglia since a neck CT in January is age indeterminate but favored to be chronic. 3. ASPECTS is 10. 4. The above information was relayed via text page to Dr. Rose Fillers at [phone] hours. Electronically Signed   By: Odessa Fleming M.D.   On: 04/25/2016 09:22      Assessment & Plan:  1. Near syncope:   Had an episode of sinus arrest today while I was talking to him in the ER.  He had at lease 4 second pause today . Associated with similar symptoms that he had this am - not as bad as it was this morning  He is on metoprolol 25 BID Will hold metoprolol. Will have him to to the CCU and have pacing pads placed. He will need cath tomorrow to evaluate for ischemia. Continue to cycle troponins   If he has a tight coronary artery, then this may all resolved with PCI.  If no evidence of ischemia or infarction then he may need a pacer.   2. CAD;  S/p CABG in 2012  3. DM  4. HTN     Alvia Grove., MD, [facility] 04/25/2016, 4:43 PM Office - [phone] Pager [phone]

## 2016-04-26 ENCOUNTER — Encounter (HOSPITAL_COMMUNITY): Payer: Self-pay | Admitting: Nurse Practitioner

## 2016-04-26 ENCOUNTER — Encounter (HOSPITAL_COMMUNITY)
Admission: EM | Disposition: A | Discharge status: Home / Self Care | Payer: Self-pay | Source: Home / Self Care | Attending: Cardiovascular Disease

## 2016-04-26 DIAGNOSIS — R55 Syncope and collapse: Secondary | ICD-10-CM | POA: Diagnosis present

## 2016-04-26 DIAGNOSIS — I455 Other specified heart block: Secondary | ICD-10-CM | POA: Diagnosis present

## 2016-04-26 DIAGNOSIS — Z951 Presence of aortocoronary bypass graft: Secondary | ICD-10-CM

## 2016-04-26 DIAGNOSIS — K2210 Ulcer of esophagus without bleeding: Secondary | ICD-10-CM

## 2016-04-26 DIAGNOSIS — I495 Sick sinus syndrome: Secondary | ICD-10-CM

## 2016-04-26 DIAGNOSIS — D509 Iron deficiency anemia, unspecified: Secondary | ICD-10-CM

## 2016-04-26 DIAGNOSIS — I10 Essential (primary) hypertension: Secondary | ICD-10-CM

## 2016-04-26 DIAGNOSIS — I252 Old myocardial infarction: Secondary | ICD-10-CM

## 2016-04-26 DIAGNOSIS — E119 Type 2 diabetes mellitus without complications: Secondary | ICD-10-CM

## 2016-04-26 DIAGNOSIS — I2510 Atherosclerotic heart disease of native coronary artery without angina pectoris: Secondary | ICD-10-CM

## 2016-04-26 DIAGNOSIS — Z87891 Personal history of nicotine dependence: Secondary | ICD-10-CM

## 2016-04-26 HISTORY — PX: CARDIAC CATHETERIZATION: SHX172

## 2016-04-26 HISTORY — PX: EP IMPLANTABLE DEVICE: SHX172B

## 2016-04-26 HISTORY — PX: A-V CARDIAC PACEMAKER INSERTION: SHX562

## 2016-04-26 LAB — LIPID PANEL
CHOLESTEROL: 192 mg/dL (ref 0–200)
HDL: 20 mg/dL — ABNORMAL LOW (ref >40)
LDL Cholesterol: UNDETERMINED mg/dL (ref 0–99)
Total CHOL/HDL Ratio: 9.6 RATIO
Triglycerides: 483 mg/dL — ABNORMAL HIGH (ref <150)
VLDL: UNDETERMINED mg/dL (ref 0–40)

## 2016-04-26 LAB — BASIC METABOLIC PANEL
ANION GAP: 13 (ref 5–15)
BUN: 14 mg/dL (ref 6–20)
CALCIUM: 9.7 mg/dL (ref 8.9–10.3)
CO2: 22 mmol/L (ref 22–32)
Chloride: 98 mmol/L — ABNORMAL LOW (ref 101–111)
Creatinine, Ser: 1.21 mg/dL (ref 0.61–1.24)
GFR calc Af Amer: >60 mL/min (ref >60)
GFR, EST NON AFRICAN AMERICAN: 58 mL/min — AB (ref >60)
GLUCOSE: 330 mg/dL — AB (ref 65–99)
Potassium: 4.3 mmol/L (ref 3.5–5.1)
Sodium: 133 mmol/L — ABNORMAL LOW (ref 135–145)

## 2016-04-26 LAB — CBC
HCT: 38.9 % — ABNORMAL LOW (ref 39.0–52.0)
Hemoglobin: 12.7 g/dL — ABNORMAL LOW (ref 13.0–17.0)
MCH: 29 pg (ref 26.0–34.0)
MCHC: 32.6 g/dL (ref 30.0–36.0)
MCV: 88.8 fL (ref 78.0–100.0)
PLATELETS: 157 10*3/uL (ref 150–400)
RBC: 4.38 MIL/uL (ref 4.22–5.81)
RDW: 14.2 % (ref 11.5–15.5)
WBC: 7 10*3/uL (ref 4.0–10.5)

## 2016-04-26 LAB — PROTIME-INR
INR: 1.1
Prothrombin Time: 14.2 seconds (ref 11.4–15.2)

## 2016-04-26 LAB — GLUCOSE, CAPILLARY
GLUCOSE-CAPILLARY: 280 mg/dL — AB (ref 65–99)
GLUCOSE-CAPILLARY: 346 mg/dL — AB (ref 65–99)
Glucose-Capillary: 221 mg/dL — ABNORMAL HIGH (ref 65–99)
Glucose-Capillary: 215 mg/dL — ABNORMAL HIGH (ref 65–99)

## 2016-04-26 SURGERY — LEFT HEART CATH AND CORS/GRAFTS ANGIOGRAPHY
Anesthesia: LOCAL

## 2016-04-26 SURGERY — PACEMAKER IMPLANT

## 2016-04-26 MED ORDER — SODIUM CHLORIDE 0.9 % IR SOLN
Status: AC
  Filled 2016-04-26: qty 2

## 2016-04-26 MED ORDER — MIDAZOLAM HCL 2 MG/2ML IJ SOLN
INTRAMUSCULAR | Status: AC
  Filled 2016-04-26: qty 2

## 2016-04-26 MED ORDER — ASPIRIN 81 MG PO CHEW
81.0000 mg | CHEWABLE_TABLET | ORAL | Status: AC
  Administered 2016-04-26: 81 mg via ORAL
  Filled 2016-04-26: qty 1

## 2016-04-26 MED ORDER — MIDAZOLAM HCL 5 MG/5ML IJ SOLN
INTRAMUSCULAR | Status: AC
  Filled 2016-04-26: qty 5

## 2016-04-26 MED ORDER — CHLORHEXIDINE GLUCONATE 4 % EX LIQD
60.0000 mL | Freq: Once | CUTANEOUS | Status: AC
  Filled 2016-04-26: qty 60

## 2016-04-26 MED ORDER — MIDAZOLAM HCL 2 MG/2ML IJ SOLN
INTRAMUSCULAR | Status: DC | PRN
  Administered 2016-04-26: 2 mg via INTRAVENOUS

## 2016-04-26 MED ORDER — IOPAMIDOL (ISOVUE-370) INJECTION 76%
INTRAVENOUS | Status: DC | PRN
  Administered 2016-04-26: 100 mL via INTRA_ARTERIAL

## 2016-04-26 MED ORDER — IOPAMIDOL (ISOVUE-370) INJECTION 76%
INTRAVENOUS | Status: AC
  Filled 2016-04-26: qty 100

## 2016-04-26 MED ORDER — SODIUM CHLORIDE 0.9% FLUSH: 3.0000 mL | INTRAVENOUS | Status: DC | PRN

## 2016-04-26 MED ORDER — ONDANSETRON HCL 4 MG/2ML IJ SOLN: 4.0000 mg | Freq: Four times a day (QID) | INTRAMUSCULAR | Status: DC | PRN

## 2016-04-26 MED ORDER — FENTANYL CITRATE (PF) 100 MCG/2ML IJ SOLN
INTRAMUSCULAR | Status: AC
  Filled 2016-04-26: qty 2

## 2016-04-26 MED ORDER — SODIUM CHLORIDE 0.9 % WEIGHT BASED INFUSION: 1.0000 mL/kg/h | INTRAVENOUS | Status: DC

## 2016-04-26 MED ORDER — HEPARIN (PORCINE) IN NACL 2-0.9 UNIT/ML-% IJ SOLN
INTRAMUSCULAR | Status: DC | PRN
  Administered 2016-04-26: 500 mL

## 2016-04-26 MED ORDER — SODIUM CHLORIDE 0.9% FLUSH: 3.0000 mL | Freq: Two times a day (BID) | INTRAVENOUS | Status: DC

## 2016-04-26 MED ORDER — SODIUM CHLORIDE 0.9 % WEIGHT BASED INFUSION
3.0000 mL/kg/h | INTRAVENOUS | Status: DC
  Administered 2016-04-26: 3 mL/kg/h via INTRAVENOUS

## 2016-04-26 MED ORDER — ACETAMINOPHEN 325 MG PO TABS: 650.0000 mg | ORAL_TABLET | ORAL | Status: DC | PRN

## 2016-04-26 MED ORDER — HEPARIN (PORCINE) IN NACL 2-0.9 UNIT/ML-% IJ SOLN
INTRAMUSCULAR | Status: AC
  Filled 2016-04-26: qty 500

## 2016-04-26 MED ORDER — CHLORHEXIDINE GLUCONATE 4 % EX LIQD
60.0000 mL | Freq: Once | CUTANEOUS | Status: AC
  Administered 2016-04-26: 4 via TOPICAL

## 2016-04-26 MED ORDER — MIDAZOLAM HCL 5 MG/5ML IJ SOLN
INTRAMUSCULAR | Status: DC | PRN
  Administered 2016-04-26: 1 mg via INTRAVENOUS

## 2016-04-26 MED ORDER — VANCOMYCIN HCL IN DEXTROSE 1-5 GM/200ML-% IV SOLN
INTRAVENOUS | Status: AC
  Filled 2016-04-26: qty 200

## 2016-04-26 MED ORDER — ACETAMINOPHEN 325 MG PO TABS: 325.0000 mg | ORAL_TABLET | ORAL | Status: DC | PRN

## 2016-04-26 MED ORDER — SODIUM CHLORIDE 0.9 % IR SOLN
80.0000 mg | Status: AC
  Administered 2016-04-26 (×2): 80 mg
  Filled 2016-04-26: qty 2

## 2016-04-26 MED ORDER — HEPARIN (PORCINE) IN NACL 2-0.9 UNIT/ML-% IJ SOLN
INTRAMUSCULAR | Status: AC
  Filled 2016-04-26: qty 1500

## 2016-04-26 MED ORDER — LIDOCAINE HCL (PF) 1 % IJ SOLN
INTRAMUSCULAR | Status: DC | PRN
  Administered 2016-04-26: 60 mL

## 2016-04-26 MED ORDER — LIDOCAINE HCL (PF) 1 % IJ SOLN
INTRAMUSCULAR | Status: AC
  Filled 2016-04-26: qty 30

## 2016-04-26 MED ORDER — IOPAMIDOL (ISOVUE-370) INJECTION 76%
INTRAVENOUS | Status: AC
  Filled 2016-04-26: qty 125

## 2016-04-26 MED ORDER — LIDOCAINE HCL (PF) 1 % IJ SOLN
INTRAMUSCULAR | Status: DC | PRN
  Administered 2016-04-26: 20 mL via INTRADERMAL

## 2016-04-26 MED ORDER — FENTANYL CITRATE (PF) 100 MCG/2ML IJ SOLN
INTRAMUSCULAR | Status: DC | PRN
  Administered 2016-04-26: 25 ug via INTRAVENOUS

## 2016-04-26 MED ORDER — LIDOCAINE HCL (PF) 1 % IJ SOLN
INTRAMUSCULAR | Status: AC
  Filled 2016-04-26: qty 60

## 2016-04-26 MED ORDER — VANCOMYCIN HCL IN DEXTROSE 1-5 GM/200ML-% IV SOLN
1000.0000 mg | Freq: Two times a day (BID) | INTRAVENOUS | Status: AC
  Administered 2016-04-27: 1000 mg via INTRAVENOUS
  Filled 2016-04-26: qty 200

## 2016-04-26 MED ORDER — HEPARIN (PORCINE) IN NACL 2-0.9 UNIT/ML-% IJ SOLN
INTRAMUSCULAR | Status: DC | PRN
  Administered 2016-04-26: 1500 mL via INTRA_ARTERIAL

## 2016-04-26 MED ORDER — INSULIN ASPART 100 UNIT/ML ~~LOC~~ SOLN
0.0000 [IU] | Freq: Three times a day (TID) | SUBCUTANEOUS | Status: DC
  Administered 2016-04-26: 7 [IU] via SUBCUTANEOUS
  Administered 2016-04-27 (×2): 11 [IU] via SUBCUTANEOUS
  Filled 2016-04-26: qty 0.2

## 2016-04-26 MED ORDER — SODIUM CHLORIDE 0.9 % IV SOLN: INTRAVENOUS | Status: DC

## 2016-04-26 MED ORDER — SODIUM CHLORIDE 0.9 % IV SOLN: 250.0000 mL | INTRAVENOUS | Status: DC | PRN

## 2016-04-26 MED ORDER — VANCOMYCIN HCL IN DEXTROSE 1-5 GM/200ML-% IV SOLN
1000.0000 mg | INTRAVENOUS | Status: AC
  Administered 2016-04-26: 1000 mg via INTRAVENOUS
  Filled 2016-04-26: qty 200

## 2016-04-26 MED ORDER — SODIUM CHLORIDE 0.9 % WEIGHT BASED INFUSION: 1.0000 mL/kg/h | INTRAVENOUS | Status: AC

## 2016-04-26 SURGICAL SUPPLY — 10 items
CATH EXPO 5F MPA-1 (CATHETERS) ×2 IMPLANT
CATH INFINITI 5 FR IM (CATHETERS) ×2 IMPLANT
CATH INFINITI 5FR MULTPACK ANG (CATHETERS) ×2 IMPLANT
KIT HEART LEFT (KITS) ×2 IMPLANT
PACK CARDIAC CATHETERIZATION (CUSTOM PROCEDURE TRAY) ×2 IMPLANT
SHEATH PINNACLE 5F 10CM (SHEATH) ×2 IMPLANT
SYR MEDRAD MARK V 150ML (SYRINGE) ×2 IMPLANT
TRANSDUCER W/STOPCOCK (MISCELLANEOUS) ×2 IMPLANT
TUBING CIL FLEX 10 FLL-RA (TUBING) ×2 IMPLANT
WIRE EMERALD 3MM-J .035X150CM (WIRE) ×2 IMPLANT

## 2016-04-26 SURGICAL SUPPLY — 7 items
CABLE SURGICAL S-101-97-12 (CABLE) ×3 IMPLANT
LEAD TENDRIL MRI 52CM LPA1200M (Lead) ×3 IMPLANT
LEAD TENDRIL MRI 58CM LPA1200M (Lead) ×3 IMPLANT
PACEMAKER ASSURITY DR-RF (Pacemaker) ×3 IMPLANT
PAD DEFIB LIFELINK (PAD) ×3 IMPLANT
SHEATH CLASSIC 8F (SHEATH) ×6 IMPLANT
TRAY PACEMAKER INSERTION (PACKS) ×3 IMPLANT

## 2016-04-26 NOTE — H&P (View-Only) (Signed)
ELECTROPHYSIOLOGY CONSULT NOTE    Patient ID: Samuel Houston MRN: [id number], DOB/AGE: 1946/08/27 70 y.o.  Admit date: 04/25/2016 Date of Consult: 04/26/2016  Primary Physician: No primary care provider on file. Primary Cardiologist: Rhona [facility]avenshi[facility]([place]) Requesting MD: Nahser  Reason for Consultation: syncope with sinus pauses  HPI:  Samuel Houston is a 70 y.o. male with a past medical history significant for hypertension, diabetes, and CAD s/p CABG.  He has done well recently until yesterday morning. He was sitting on the commode when he became hot, dizzy, and felt like he was going to pass out.  He came to the ER for further evaluation.  He was also complaining of a headache which in the past he has been told was his anginal equivalent.  While in the ER, he developed a sinus pause of >4 seconds with symptoms similar to the morning's episode but this time with frank syncope.  He was admitted for further evaluation and cardiac catheterization is planned for later today.  EP has been asked to evaluate for treatment options.   He currently denies chest pain, shortness of breath, LE edema, recent fevers, chills, nausea or vomiting.   Past Medical History:  Diagnosis Date  . CAD (coronary artery disease)    s/p CABG  . Diabetes mellitus   . Hypertension      Surgical History:  Past Surgical History:  Procedure Laterality Date  . CORONARY ARTERY BYPASS GRAFT  08-07-11     Prescriptions Prior to Admission  Medication Sig Dispense Refill Last Dose  . aspirin 81 MG tablet Take 81 mg by mouth daily.   04/24/2016 at Unknown time  . DOCOSAHEXAENOIC ACID PO Take 2 capsules by mouth daily.   04/24/2016 at Unknown time  . fenofibrate 160 MG tablet Take 160 mg by mouth daily.   04/24/2016 at Unknown time  . ferrous sulfate 325 (65 FE) MG EC tablet Take 325 mg by mouth daily.   04/24/2016 at Unknown time  . folic acid (FOLVITE) 1 MG tablet Take 1 mg by mouth daily.   04/24/2016 at  Unknown time  . glipiZIDE (GLUCOTROL) 10 MG tablet Take 10 mg by mouth daily.   04/24/2016 at Unknown time  . ibuprofen (ADVIL,MOTRIN) 200 MG tablet Take 200 mg by mouth every 6 (six) hours as needed for moderate pain.   04/24/2016 at Unknown time  . lisinopril (PRINIVIL,ZESTRIL) 2.5 MG tablet Take 2.5 mg by mouth daily.    04/24/2016 at Unknown time  . metFORMIN (GLUCOPHAGE) 1000 MG tablet Take 1,000 mg by mouth 2 (two) times daily.   04/24/2016 at Unknown time  . metoprolol tartrate (LOPRESSOR) 25 MG tablet Take 25 mg by mouth 2 (two) times daily.   04/24/2016 at 1900  . Multiple Vitamins-Minerals (ZINC PO) Take 1 tablet by mouth daily.   04/24/2016 at Unknown time  . omeprazole (PRILOSEC) 40 MG capsule Take 40 mg by mouth daily.   04/24/2016 at Unknown time  . saw palmetto 80 MG capsule Take 80 mg by mouth 2 (two) times daily.   04/24/2016 at Unknown time  . tamsulosin (FLOMAX) 0.4 MG CAPS capsule Take 0.4 mg by mouth daily.   04/24/2016 at Unknown time    Inpatient Medications:  .  stroke: mapping our early stages of recovery book   Does not apply Once  . antiseptic oral rinse  7 mL Mouth Rinse q12n4p  . aspirin  81 mg Oral Pre-Cath  . aspirin EC  81 mg  Oral Daily  . chlorhexidine  15 mL Mouth Rinse BID  . Chlorhexidine Gluconate Cloth  6 each Topical Q0600  . enoxaparin (LOVENOX) injection  40 mg Subcutaneous Q24H  . ferrous sulfate  325 mg Oral Daily  . insulin aspart  0-15 Units Subcutaneous TID WC  . lisinopril  2.5 mg Oral Daily  . mupirocin ointment  1 application Nasal BID  . pantoprazole  40 mg Oral Daily  . sodium chloride flush  3 mL Intravenous Q12H    Allergies:  Allergies  Allergen Reactions  . Ascorbate     Other reaction(s): Other Causes sore in mouth and aggregates esophagus  . Penicillins     Penicillins results in rash    Social History   Social History  . Marital status: Married    Spouse name: N/A  . Number of children: N/A  . Years of education: N/A    Occupational History  . Not on file.   Social History Main Topics  . Smoking status: Former Smoker    Packs/day: 3.00    Types: Cigarettes    Quit date: 08/02/2004  . Smokeless tobacco: Never Used  . Alcohol use No  . Drug use: No  . Sexual activity: Not on file   Other Topics Concern  . Not on file   Social History Narrative  . No narrative on file     Family History: no sudden death    Review of Systems: All other systems reviewed and are otherwise negative except as noted above.  Physical Exam: Vitals:   04/26/16 0033 04/26/16 0151 04/26/16 0233 04/26/16 0417  BP: 139/64 (!) 175/92 (!) 179/96 (!) 171/89  Pulse: 84 90 88 82  Resp: 18 18 18 18   Temp:    98.7 F (37.1 C)  TempSrc:    Oral  SpO2: 95% 95% 94% 96%  Weight:    176 lb (79.8 kg)  Height:        GEN- The patient is well appearing, alert and oriented x 3 today.   HEENT: normocephalic, atraumatic; sclera clear, conjunctiva pink; hearing intact; oropharynx clear; neck supple  Lungs- Clear to ausculation bilaterally, normal work of breathing.  No wheezes, rales, rhonchi Heart- Regular rate and rhythm, no murmurs, rubs or gallops  GI- soft, non-tender, non-distended, bowel sounds present  Extremities- no clubbing, cyanosis, or edema; DP/PT/radial pulses 2+ bilaterally MS- no significant deformity or atrophy Skin- warm and dry, no rash or lesion Psych- euthymic mood, full affect Neuro- strength and sensation are intact  Labs:   Lab Results  Component Value Date   WBC 7.0 04/26/2016   HGB 12.7 (L) 04/26/2016   HCT 38.9 (L) 04/26/2016   MCV 88.8 04/26/2016   PLT 157 04/26/2016     Recent Labs Lab 04/25/16 0901 04/25/16 0909  NA 135 135  K 4.4 4.4  CL 102 102  CO2 21*  --   BUN 16 18  CREATININE 1.27* 1.10  CALCIUM 9.3  --   PROT 6.7  --   BILITOT 0.7  --   ALKPHOS 21*  --   ALT 57  --   AST 39  --   GLUCOSE 280* 278*      Radiology/Studies: Mr Brain Wo Contrast Result Date:  04/25/2016 CLINICAL DATA:  Near syncopal episode, with diaphoresis. Slurred speech and headache. Exam now normal. EXAM: MRI HEAD WITHOUT CONTRAST TECHNIQUE: Multiplanar, multiecho pulse sequences of the brain and surrounding structures were obtained without intravenous contrast. COMPARISON:  CT  head earlier today. FINDINGS: No evidence for acute infarction, hemorrhage, mass lesion, hydrocephalus, or extra-axial fluid. Mild atrophy, not unexpected for age. Mild subcortical and periventricular T2 and FLAIR hyperintensities, likely chronic microvascular ischemic change. Chronic lenticulostriate lacunar infarct affects the RIGHT lentiform nucleus and surrounding white matter. Flow voids are maintained throughout the carotid, basilar, and vertebral arteries. There are no areas of chronic hemorrhage. Normal pituitary and cerebellar tonsils. Cervical spondylosis with disc space narrowing, and osseous spurring at the C3-4 level resulting in apparent spinal stenosis. Visualized calvarium, and skull base unremarkable. Scalp and extracranial soft tissues, orbits, sinuses, and mastoids show no acute process. Chronic sinus disease, most notable LEFT maxillary region. IMPRESSION: Atrophy and small vessel disease.  No acute intracranial findings. 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 Within limits for assessment on routine brain MR, no evidence for acute or chronic posterior circulation ischemia. No apparent intracranial vascular stenosis or dissection. Electronically Signed   By: Elsie Stain M.D.   On: 04/25/2016 15:43   Ct Head Code Stroke W/o Cm Result Date: 04/25/2016 CLINICAL DATA:  Code stroke. 70 year old male with slurred speech and headache. Initial encounter. EXAM: CT HEAD WITHOUT CONTRAST TECHNIQUE: Contiguous axial images were obtained from the base of the skull through the vertex without intravenous contrast. COMPARISON:  Neck CT 09/21/2015. FINDINGS: Visualized paranasal sinuses and  mastoids are stable and well pneumatized. No acute osseous abnormality identified. Visualized orbits and scalp soft tissues are within normal limits. Calcified atherosclerosis at the skull base. No suspicious intracranial vascular hyperdensity. No cortically based acute infarct identified. ASPECTS [facility] Stroke Program Early CT Score) Total score (0-10 with 10 being normal): 10 Chronic appearing right basal ganglia lacunar infarct is new since January. Associated right anterior frontal lobe white matter hypodensity about the frontal horn. Elsewhere gray-white matter differentiation is within normal limits. No acute intracranial hemorrhage identified. IMPRESSION: 1. No acute cortically based infarct or acute intracranial hemorrhage identified. 2. New lacunar infarct of the right basal ganglia since a neck CT in January is age indeterminate but favored to be chronic. 3. ASPECTS is 10. 4. The above information was relayed via text page to Dr. Rose Fillers at [phone] hours. Electronically Signed   By: Odessa Fleming M.D.   On: 04/25/2016 09:22    JYN:WGNFA rhythm  TELEMETRY: sinus rhythm with periods of sinus arrest  Assessment/Plan: 1.  Syncope with documented sinus arrest The patient has symptomatic sinus bradycardia in the setting of low dose BB use.  He has known CAD and it is felt that BB is required long term for management.  Plan for LHC today to rule out ischemia.  If cath without ischemic cause, would recommend pacemaker implantation.  Risks, benefits to PPM implantation reviewed with patient and son who wish to proceed.    2.  CAD s/p CABG LHC planned today   3.  HTN Stable No change required today  Signed, Gypsy Balsam, NP 04/26/2016 8:06 AM   I have seen and examined this patient with Gypsy Balsam.  Agree with above, note added to reflect my findings.  On exam, regular rhythm, no murmurs, lungs clear.  Presented with presyncope and found to have documented sinus arrest.  Plan for [facility] and if  no ischemia, Filiberto Wamble plan for pacemaker.  Risks and benefits discussed, patient agrees to the procedure.    Kayon Dozier M. Virginio Isidore MD 04/26/2016 12:21 PM

## 2016-04-26 NOTE — Consult Note (Signed)
ELECTROPHYSIOLOGY CONSULT NOTE    Patient ID: Samuel Houston MRN: [id number], DOB/AGE: 1946/08/27 70 y.o.  Admit date: 04/25/2016 Date of Consult: 04/26/2016  Primary Physician: No primary care provider on file. Primary Cardiologist: [facility]([place]) Requesting MD: Nahser  Reason for Consultation: syncope with sinus pauses  HPI:  Samuel BattyKenneth Edward Ebright is a 70 y.o. male with a past medical history significant for hypertension, diabetes, and CAD s/p CABG.  He has done well recently until yesterday morning. He was sitting on the commode when he became hot, dizzy, and felt like he was going to pass out.  He came to the ER for further evaluation.  He was also complaining of a headache which in the past he has been told was his anginal equivalent.  While in the ER, he developed a sinus pause of >4 seconds with symptoms similar to the morning's episode but this time with frank syncope.  He was admitted for further evaluation and cardiac catheterization is planned for later today.  EP has been asked to evaluate for treatment options.   He currently denies chest pain, shortness of breath, LE edema, recent fevers, chills, nausea or vomiting.   Past Medical History:  Diagnosis Date  . CAD (coronary artery disease)    s/p CABG  . Diabetes mellitus   . Hypertension      Surgical History:  Past Surgical History:  Procedure Laterality Date  . CORONARY ARTERY BYPASS GRAFT  08-07-11     Prescriptions Prior to Admission  Medication Sig Dispense Refill Last Dose  . aspirin 81 MG tablet Take 81 mg by mouth daily.   04/24/2016 at Unknown time  . DOCOSAHEXAENOIC ACID PO Take 2 capsules by mouth daily.   04/24/2016 at Unknown time  . fenofibrate 160 MG tablet Take 160 mg by mouth daily.   04/24/2016 at Unknown time  . ferrous sulfate 325 (65 FE) MG EC tablet Take 325 mg by mouth daily.   04/24/2016 at Unknown time  . folic acid (FOLVITE) 1 MG tablet Take 1 mg by mouth daily.   04/24/2016 at  Unknown time  . glipiZIDE (GLUCOTROL) 10 MG tablet Take 10 mg by mouth daily.   04/24/2016 at Unknown time  . ibuprofen (ADVIL,MOTRIN) 200 MG tablet Take 200 mg by mouth every 6 (six) hours as needed for moderate pain.   04/24/2016 at Unknown time  . lisinopril (PRINIVIL,ZESTRIL) 2.5 MG tablet Take 2.5 mg by mouth daily.    04/24/2016 at Unknown time  . metFORMIN (GLUCOPHAGE) 1000 MG tablet Take 1,000 mg by mouth 2 (two) times daily.   04/24/2016 at Unknown time  . metoprolol tartrate (LOPRESSOR) 25 MG tablet Take 25 mg by mouth 2 (two) times daily.   04/24/2016 at 1900  . Multiple Vitamins-Minerals (ZINC PO) Take 1 tablet by mouth daily.   04/24/2016 at Unknown time  . omeprazole (PRILOSEC) 40 MG capsule Take 40 mg by mouth daily.   04/24/2016 at Unknown time  . saw palmetto 80 MG capsule Take 80 mg by mouth 2 (two) times daily.   04/24/2016 at Unknown time  . tamsulosin (FLOMAX) 0.4 MG CAPS capsule Take 0.4 mg by mouth daily.   04/24/2016 at Unknown time    Inpatient Medications:  .  stroke: mapping our early stages of recovery book   Does not apply Once  . antiseptic oral rinse  7 mL Mouth Rinse q12n4p  . aspirin  81 mg Oral Pre-Cath  . aspirin EC  81 mg  Oral Daily  . chlorhexidine  15 mL Mouth Rinse BID  . Chlorhexidine Gluconate Cloth  6 each Topical Q0600  . enoxaparin (LOVENOX) injection  40 mg Subcutaneous Q24H  . ferrous sulfate  325 mg Oral Daily  . insulin aspart  0-15 Units Subcutaneous TID WC  . lisinopril  2.5 mg Oral Daily  . mupirocin ointment  1 application Nasal BID  . pantoprazole  40 mg Oral Daily  . sodium chloride flush  3 mL Intravenous Q12H    Allergies:  Allergies  Allergen Reactions  . Ascorbate     Other reaction(s): Other Causes sore in mouth and aggregates esophagus  . Penicillins     Penicillins results in rash    Social History   Social History  . Marital status: Married    Spouse name: N/A  . Number of children: N/A  . Years of education: N/A    Occupational History  . Not on file.   Social History Main Topics  . Smoking status: Former Smoker    Packs/day: 3.00    Types: Cigarettes    Quit date: 08/02/2004  . Smokeless tobacco: Never Used  . Alcohol use No  . Drug use: No  . Sexual activity: Not on file   Other Topics Concern  . Not on file   Social History Narrative  . No narrative on file     Family History: no sudden death    Review of Systems: All other systems reviewed and are otherwise negative except as noted above.  Physical Exam: Vitals:   04/26/16 0033 04/26/16 0151 04/26/16 0233 04/26/16 0417  BP: 139/64 (!) 175/92 (!) 179/96 (!) 171/89  Pulse: 84 90 88 82  Resp: 18 18 18 18   Temp:    98.7 F (37.1 C)  TempSrc:    Oral  SpO2: 95% 95% 94% 96%  Weight:    176 lb (79.8 kg)  Height:        GEN- The patient is well appearing, alert and oriented x 3 today.   HEENT: normocephalic, atraumatic; sclera clear, conjunctiva pink; hearing intact; oropharynx clear; neck supple  Lungs- Clear to ausculation bilaterally, normal work of breathing.  No wheezes, rales, rhonchi Heart- Regular rate and rhythm, no murmurs, rubs or gallops  GI- soft, non-tender, non-distended, bowel sounds present  Extremities- no clubbing, cyanosis, or edema; DP/PT/radial pulses 2+ bilaterally MS- no significant deformity or atrophy Skin- warm and dry, no rash or lesion Psych- euthymic mood, full affect Neuro- strength and sensation are intact  Labs:   Lab Results  Component Value Date   WBC 7.0 04/26/2016   HGB 12.7 (L) 04/26/2016   HCT 38.9 (L) 04/26/2016   MCV 88.8 04/26/2016   PLT 157 04/26/2016     Recent Labs Lab 04/25/16 0901 04/25/16 0909  NA 135 135  K 4.4 4.4  CL 102 102  CO2 21*  --   BUN 16 18  CREATININE 1.27* 1.10  CALCIUM 9.3  --   PROT 6.7  --   BILITOT 0.7  --   ALKPHOS 21*  --   ALT 57  --   AST 39  --   GLUCOSE 280* 278*      Radiology/Studies: Mr Brain Wo Contrast Result Date:  04/25/2016 CLINICAL DATA:  Near syncopal episode, with diaphoresis. Slurred speech and headache. Exam now normal. EXAM: MRI HEAD WITHOUT CONTRAST TECHNIQUE: Multiplanar, multiecho pulse sequences of the brain and surrounding structures were obtained without intravenous contrast. COMPARISON:  CT  head earlier today. FINDINGS: No evidence for acute infarction, hemorrhage, mass lesion, hydrocephalus, or extra-axial fluid. Mild atrophy, not unexpected for age. Mild subcortical and periventricular T2 and FLAIR hyperintensities, likely chronic microvascular ischemic change. Chronic lenticulostriate lacunar infarct affects the RIGHT lentiform nucleus and surrounding white matter. Flow voids are maintained throughout the carotid, basilar, and vertebral arteries. There are no areas of chronic hemorrhage. Normal pituitary and cerebellar tonsils. Cervical spondylosis with disc space narrowing, and osseous spurring at the C3-4 level resulting in apparent spinal stenosis. Visualized calvarium, and skull base unremarkable. Scalp and extracranial soft tissues, orbits, sinuses, and mastoids show no acute process. Chronic sinus disease, most notable LEFT maxillary region. IMPRESSION: Atrophy and small vessel disease.  No acute intracranial findings. 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 Within limits for assessment on routine brain MR, no evidence for acute or chronic posterior circulation ischemia. No apparent intracranial vascular stenosis or dissection. Electronically Signed   By: Elsie Stain M.D.   On: 04/25/2016 15:43   Ct Head Code Stroke W/o Cm Result Date: 04/25/2016 CLINICAL DATA:  Code stroke. 70 year old male with slurred speech and headache. Initial encounter. EXAM: CT HEAD WITHOUT CONTRAST TECHNIQUE: Contiguous axial images were obtained from the base of the skull through the vertex without intravenous contrast. COMPARISON:  Neck CT 09/21/2015. FINDINGS: Visualized paranasal sinuses and  mastoids are stable and well pneumatized. No acute osseous abnormality identified. Visualized orbits and scalp soft tissues are within normal limits. Calcified atherosclerosis at the skull base. No suspicious intracranial vascular hyperdensity. No cortically based acute infarct identified. ASPECTS [facility] Stroke Program Early CT Score) Total score (0-10 with 10 being normal): 10 Chronic appearing right basal ganglia lacunar infarct is new since January. Associated right anterior frontal lobe white matter hypodensity about the frontal horn. Elsewhere gray-white matter differentiation is within normal limits. No acute intracranial hemorrhage identified. IMPRESSION: 1. No acute cortically based infarct or acute intracranial hemorrhage identified. 2. New lacunar infarct of the right basal ganglia since a neck CT in January is age indeterminate but favored to be chronic. 3. ASPECTS is 10. 4. The above information was relayed via text page to Dr. Rose Fillers at [phone] hours. Electronically Signed   By: Odessa Fleming M.D.   On: 04/25/2016 09:22    JYN:WGNFA rhythm  TELEMETRY: sinus rhythm with periods of sinus arrest  Assessment/Plan: 1.  Syncope with documented sinus arrest The patient has symptomatic sinus bradycardia in the setting of low dose BB use.  He has known CAD and it is felt that BB is required long term for management.  Plan for LHC today to rule out ischemia.  If cath without ischemic cause, would recommend pacemaker implantation.  Risks, benefits to PPM implantation reviewed with patient and son who wish to proceed.    2.  CAD s/p CABG LHC planned today   3.  HTN Stable No change required today  Signed, Gypsy Balsam, NP 04/26/2016 8:06 AM   I have seen and examined this patient with Gypsy Balsam.  Agree with above, note added to reflect my findings.  On exam, regular rhythm, no murmurs, lungs clear.  Presented with presyncope and found to have documented sinus arrest.  Plan for [facility] and if  no ischemia, Sutton Hirsch plan for pacemaker.  Risks and benefits discussed, patient agrees to the procedure.    Shaton Lore M. Kimyah Frein MD 04/26/2016 12:21 PM

## 2016-04-26 NOTE — Interval H&P Note (Signed)
Cath Lab Visit (complete for each Cath Lab visit)  Clinical Evaluation Leading to the Procedure:   ACS: Yes.    Non-ACS:    Anginal Classification: CCS IV  Anti-ischemic medical therapy: Minimal Therapy (1 class of medications)  Non-Invasive Test Results: No non-invasive testing performed  Prior CABG: No previous CABG      History and Physical Interval Note:  04/26/2016 2:29 PM  Samuel Houston  has presented today for surgery, with the diagnosis of syncope, cp  The various methods of treatment have been discussed with the patient and family. After consideration of risks, benefits and other options for treatment, the patient has consented to  Procedure(s): Left Heart Cath and Cors/Grafts Angiography (N/A) as a surgical intervention .  The patient's history has been reviewed, patient examined, no change in status, stable for surgery.  I have reviewed the patient's chart and labs.  Questions were answered to the patient's satisfaction.     Lance MussJayadeep Estell Puccini

## 2016-04-26 NOTE — Progress Notes (Signed)
Patient Name: Samuel Houston Date of Encounter: 04/26/2016  Principal Problem:   Near syncope Active Problems:   Dizziness   Sinus pause   Primary Cardiologist: Dr Rhona Leavenshiu in [facility] Patient Profile: 70 y.o. male with a PMHx of CAD , CABG , who [facility]2016 for evaluation of near syncope  SUBJECTIVE: No sx overnight.  OBJECTIVE Vitals:   04/26/16 0151 04/26/16 0233 04/26/16 0417 04/26/16 0816  BP: (!) 175/92 (!) 179/96 (!) 171/89 (!) 161/92  Pulse: 90 88 82   Resp: 18 18 18    Temp:   98.7 F (37.1 C)   TempSrc:   Oral   SpO2: 95% 94% 96% 98%  Weight:   176 lb (79.8 kg)   Height:        Intake/Output Summary (Last 24 hours) at 04/26/16 0824 Last data filed at 04/26/16 0615  Gross per 24 hour  Intake                0 ml  Output             1700 ml  Net            -1700 ml   Filed Weights   04/25/16 0903 04/25/16 1857 04/26/16 0417  Weight: 181 lb 14.1 oz (82.5 kg) 178 lb (80.7 kg) 176 lb (79.8 kg)    PHYSICAL EXAM General: Well developed, well nourished, male in no acute distress. Head: Normocephalic, atraumatic.  Neck: Supple without bruits, JVD not elevated. Lungs:  Resp regular and unlabored, CTA. Heart: RRR, S1, S2, no S3, S4, or murmur; no rub. Abdomen: Soft, non-tender, non-distended, BS + x 4.  Extremities: No clubbing, cyanosis, edema.  Neuro: Alert and oriented X 3. Moves all extremities spontaneously. Psych: Normal affect.  LABS: CBC:  Recent Labs  04/25/16 0901 04/25/16 0909 04/26/16 0407  WBC 7.3  --  7.0  NEUTROABS 4.6  --   --   HGB 11.8* 12.6* 12.7*  HCT 36.8* 37.0* 38.9*  MCV 88.9  --  88.8  PLT 127*  --  157   INR:  Recent Labs  04/25/16 0901  INR 1.07   Basic Metabolic Panel:  Recent Labs  36/64/4008/24/17 0901 04/25/16 0909  NA 135 135  K 4.4 4.4  CL 102 102  CO2 21*  --   GLUCOSE 280* 278*  BUN 16 18  CREATININE 1.27* 1.10  CALCIUM 9.3  --    Liver Function Tests:  Recent Labs   04/25/16 0901  AST 39  ALT 57  ALKPHOS 21*  BILITOT 0.7  PROT 6.7  ALBUMIN 3.7   Cardiac Enzymes:  Recent Labs  04/25/16 1136 04/25/16 1749 04/25/16 2256  TROPONINI <0.03 <0.03 <0.03    Recent Labs  04/25/16 0911  TROPIPOC 0.00   Fasting Lipid Panel:  Recent Labs  04/26/16 0407  CHOL 192  HDL 20*  LDLCALC UNABLE TO CALCULATE IF TRIGLYCERIDE OVER 400 mg/dL  TRIG [id number]*  CHOLHDL 9.6   TELE:  SR, Pause of 5.6 sec 08/24 at 16:45     ECG: 08/25 SR, inc RBBB  Radiology/Studies: Mr Brain Wo Contrast Result Date: 04/25/2016 CLINICAL DATA:  Near syncopal episode, with diaphoresis. Slurred speech and headache. Exam now normal. EXAM: MRI HEAD WITHOUT CONTRAST TECHNIQUE: Multiplanar, multiecho pulse sequences of the brain and surrounding structures were obtained without intravenous contrast. COMPARISON:  CT head earlier today. FINDINGS: No evidence for acute infarction, hemorrhage, mass lesion, hydrocephalus, or extra-axial  fluid. Mild atrophy, not unexpected for age. Mild subcortical and periventricular T2 and FLAIR hyperintensities, likely chronic microvascular ischemic change. Chronic lenticulostriate lacunar infarct affects the RIGHT lentiform nucleus and surrounding white matter. Flow voids are maintained throughout the carotid, basilar, and vertebral arteries. There are no areas of chronic hemorrhage. Normal pituitary and cerebellar tonsils. Cervical spondylosis with disc space narrowing, and osseous spurring at the C3-4 level resulting in apparent spinal stenosis. Visualized calvarium, and skull base unremarkable. Scalp and extracranial soft tissues, orbits, sinuses, and mastoids show no acute process. Chronic sinus disease, most notable LEFT maxillary region. IMPRESSION: Atrophy and small vessel disease.  No acute intracranial findings. 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 Within limits for assessment on routine brain MR, no evidence for acute or  chronic posterior circulation ischemia. No apparent intracranial vascular stenosis or dissection. Electronically Signed   By: Elsie Stain M.D.   On: 04/25/2016 15:43   Ct Head Code Stroke W/o Cm Result Date: 04/25/2016 CLINICAL DATA:  Code stroke. 70 year old male with slurred speech and headache. Initial encounter. EXAM: CT HEAD WITHOUT CONTRAST TECHNIQUE: Contiguous axial images were obtained from the base of the skull through the vertex without intravenous contrast. COMPARISON:  Neck CT 09/21/2015. FINDINGS: Visualized paranasal sinuses and mastoids are stable and well pneumatized. No acute osseous abnormality identified. Visualized orbits and scalp soft tissues are within normal limits. Calcified atherosclerosis at the skull base. No suspicious intracranial vascular hyperdensity. No cortically based acute infarct identified. ASPECTS [facility] Stroke Program Early CT Score) Total score (0-10 with 10 being normal): 10 Chronic appearing right basal ganglia lacunar infarct is new since January. Associated right anterior frontal lobe white matter hypodensity about the frontal horn. Elsewhere gray-white matter differentiation is within normal limits. No acute intracranial hemorrhage identified. IMPRESSION: 1. No acute cortically based infarct or acute intracranial hemorrhage identified. 2. New lacunar infarct of the right basal ganglia since a neck CT in January is age indeterminate but favored to be chronic. 3. ASPECTS is 10. 4. The above information was relayed via text page to Dr. Rose Fillers at [phone] hours. Electronically Signed   By: Odessa Fleming M.D.   On: 04/25/2016 09:22     Current Medications:  .  stroke: mapping our early stages of recovery book   Does not apply Once  . antiseptic oral rinse  7 mL Mouth Rinse q12n4p  . aspirin  81 mg Oral Pre-Cath  . aspirin EC  81 mg Oral Daily  . chlorhexidine  15 mL Mouth Rinse BID  . Chlorhexidine Gluconate Cloth  6 each Topical Q0600  . enoxaparin  (LOVENOX) injection  40 mg Subcutaneous Q24H  . ferrous sulfate  325 mg Oral Daily  . insulin aspart  0-15 Units Subcutaneous TID WC  . lisinopril  2.5 mg Oral Daily  . mupirocin ointment  1 application Nasal BID  . pantoprazole  40 mg Oral Daily  . sodium chloride flush  3 mL Intravenous Q12H   . sodium chloride 3 mL/kg/hr (04/26/16 0818)   Followed by  . sodium chloride      ASSESSMENT AND PLAN: Principal Problem:   Near syncope - 2nd sinus pauses - PPM per EP  Active Problems:   Dizziness - see above    Sinus pause - metoprolol held - EP seeing, for PPM later today    CAD - had HA yesterday, his anginal equivalent - for cath today - on ASA, ACE, w/ BB on hold    Dyslipidemia -  pta on saw palmetto and fenofibrate - low HDL and elevated trig on profile - f/u with Dr Rhona Leavens      DM - glucotrol and Glucophage on hold - SSI per IM  Signed, Leanna Battles 8:24 AM 04/26/2016  Attending Note:   The patient was seen and examined.  Agree with assessment and plan as noted above.  Changes made to the above note as needed.  Patient seen and independently examined with Theodore Demark, PA .   We discussed all aspects of the encounter. I agree with the assessment and plan as stated above.  Pt has done well. Troponin levels are negative  Trigs are 483.  Unable to determine the LDL due to his markedly elevated Trig level  For cath today and then possible pacer    I have spent a total of 40 minutes with patient reviewing hospital  notes , telemetry, EKGs, labs and examining patient as well as establishing an assessment and plan that was discussed with the patient. > 50% of time was spent in direct patient care.    Vesta Mixer, Montez Hageman., MD, [facility] 04/26/2016, 9:37 AM [address],  [address] Office [phone] Pager [phone]

## 2016-04-26 NOTE — Progress Notes (Signed)
OT Cancellation Note  Patient Details Name: Alfredo BattyKenneth Edward Newmark MRN: [id number] DOB: 1946/07/27   Cancelled Treatment:    Reason Eval/Treat Not Completed: Patient at procedure or test/ unavailable.  Pt in cath lab.  Will reattempt.  Richey Doolittle Lowellonarpe, OTR/L [phone]   Jeani HawkingConarpe, River Mckercher M 04/26/2016, 3:49 PM

## 2016-04-26 NOTE — Progress Notes (Signed)
Subjective: Currently, the patient is feeling much improved. Denies any further episodes of HA since yesterday. No further episodes of bradycardia.  Interval Events: Sinus arrest in the ED. Wearing pacing pads. Cath today and PPM.  Objective: Vital signs in last 24 hours: Vitals:   04/26/16 0816 04/26/16 1158 04/26/16 1348 04/26/16 1414  BP: (!) 161/92 (!) 168/100    Pulse:      Resp:      Temp:   98.6 F (37 C)   TempSrc:   Oral   SpO2: 98% 98%  97%  Weight:      Height:       Physical Exam: Physical Exam  Constitutional: No distress.  Cardiovascular: Normal rate, regular rhythm and normal heart sounds.   Pulmonary/Chest: Effort normal and breath sounds normal.  Abdominal: Soft. Bowel sounds are normal. There is no tenderness.  Musculoskeletal: He exhibits no edema.   Labs: CBC:  Recent Labs Lab 04/25/16 0901 04/25/16 0909 04/26/16 0407  WBC 7.3  --  7.0  NEUTROABS 4.6  --   --   HGB 11.8* 12.6* 12.7*  HCT 36.8* 37.0* 38.9*  MCV 88.9  --  88.8  PLT 127*  --  157   Metabolic Panel:  Recent Labs Lab 04/25/16 0901 04/25/16 0909 04/26/16 0837  NA 135 135 133*  K 4.4 4.4 4.3  CL 102 102 98*  CO2 21*  --  22  GLUCOSE 280* 278* 330*  BUN 16 18 14   CREATININE 1.27* 1.10 1.21  CALCIUM 9.3  --  9.7  ALT 57  --   --   ALKPHOS 21*  --   --   BILITOT 0.7  --   --   PROT 6.7  --   --   ALBUMIN 3.7  --   --   LABPROT 14.0  --   --   INR 1.07  --   --    Cardiac Labs:  Recent Labs Lab 04/25/16 0911 04/25/16 1136 04/25/16 1749 04/25/16 2256  TROPIPOC 0.00  --   --   --   TROPONINI  --  <0.03 <0.03 <0.03   BG:  Recent Labs Lab 04/25/16 1552 04/25/16 2022 04/25/16 2315 04/26/16 0739 04/26/16 1136  GLUCAP 187* 269* 268* 280* 221*    Medications: Infusions: . sodium chloride    . sodium chloride 1 mL/kg/hr (04/26/16 1006)   Scheduled Medications: . [MAR Hold]  stroke: mapping our early stages of recovery book   Does not apply Once  . [facility][MAR  Hold] antiseptic oral rinse  7 mL Mouth Rinse q12n4p  . [MAR Hold] aspirin EC  81 mg Oral Daily  . [MAR Hold] chlorhexidine  15 mL Mouth Rinse BID  . [MAR Hold] Chlorhexidine Gluconate Cloth  6 each Topical Q0600  . [MAR Hold] enoxaparin (LOVENOX) injection  40 mg Subcutaneous Q24H  . [MAR Hold] ferrous sulfate  325 mg Oral Daily  . gentamicin irrigation  80 mg Irrigation To Cath  . [MAR Hold] insulin aspart  0-15 Units Subcutaneous TID WC  . [MAR Hold] lisinopril  2.5 mg Oral Daily  . [MAR Hold] mupirocin ointment  1 application Nasal BID  . [MAR Hold] pantoprazole  40 mg Oral Daily  . sodium chloride flush  3 mL Intravenous Q12H  . vancomycin  1,000 mg Intravenous To Cath   PRN Medications: sodium chloride, sodium chloride flush  Assessment/Plan: Pt is a 70 y.o. yo male with a PMHx of CAD and erosive esophagitis who  was admitted on 04/25/2016 with symptoms of near-syncope, which was determined to be secondary to sinus arrest.  Dizziness / Near Syncope-- 2/2 sinus arrest and bradycardia. Will have LHC today for w/u of possible ischemic causes and EP to place PPM. - w/u per Cards  Coronary artery disease-- MI/CABG 08/04/11. PCSK9 inhibitor at home (intolerant of statins). Primary Cardiologist: [name] with [facility]. HA is anginal equivalent. Trops negative w/o EKG significant for ischemia. LHC today. 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 Hgb 12.7 w/o signs of active bleeding. Continue home iron and PPI.   HTN-- Home meds: lisinopril 2.5mg  and lopressor 25mg  BID - hold metop for bradycardia - hold lisinopril for LHC  DM-- A1c 6.9 on 12/2015. Home meds: metformin and glipizide. BG elevated since presentation in 260-280. - increase from moderate to resistant-SSI  Length of Stay: 1 day(s) Dispo: Anticipated discharge in approximately 1 day(s).  Carolynn Comment, MD Pager: [phone] (7AM-5PM) 04/26/2016, 2:26 PM

## 2016-04-26 NOTE — Progress Notes (Signed)
PT Cancellation Note  Patient Details Name: Alfredo BattyKenneth Edward Delmonaco MRN: [id number] DOB: 1946-04-20   Cancelled Treatment:    Reason Eval/Treat Not Completed: Medical issues which prohibited therapy.  Pt going for more cardiac testing today and nursing asked PT to hold off on eval until tomorrow.  Pt aware and agreeable to plan.   Judson RochHildreth, Jeronda Don Gardner 04/26/2016, 11:27 AM 04/26/2016   Ranae PalmsElizabeth Ailyne Pawley, PT

## 2016-04-26 NOTE — Progress Notes (Signed)
STROKE TEAM PROGRESS NOTE   HISTORY OF PRESENT ILLNESS (per record) Samuel BattyKenneth Edward Houston is an 70 y.o. male presenting to hospital after suddenly feeling hot, diaphoretic and then started to lean down to the tub but unsure if he passed out. In route he had intermittent slurred speech which has fully resolved. Currently he is asymptomatic. No acute infarct on CT head. Patient was last known well 04/25/2016 at 08:25. Patient was not administered IV t-PA secondary to symptoms resolved. He was admitted for further evaluation and treatment.   SUBJECTIVE (INTERVAL HISTORY) His family is at the bedside.  Overall he feels his condition is stable. He recounted HPI with Dr. Pearlean BrownieSethi. He does report difficulty figuring out what to say, difficulty getting his words out. He feels he is better now.    OBJECTIVE Temp:  [97.6 F (36.4 C)-99.2 F (37.3 C)] 98.7 F (37.1 C) (08/25 0417) Pulse Rate:  [62-90] 82 (08/25 0417) Cardiac Rhythm: Normal sinus rhythm (08/25 0833) Resp:  [13-18] 18 (08/25 0417) BP: (139-179)/(64-99) 161/92 (08/25 0816) SpO2:  [94 %-100 %] 98 % (08/25 0816) Weight:  [79.8 kg (176 lb)-82.1 kg (181 lb)] 79.8 kg (176 lb) (08/25 0417)  CBC:  Recent Labs Lab 04/25/16 0901 04/25/16 0909 04/26/16 0407  WBC 7.3  --  7.0  NEUTROABS 4.6  --   --   HGB 11.8* 12.6* 12.7*  HCT 36.8* 37.0* 38.9*  MCV 88.9  --  88.8  PLT 127*  --  157    Basic Metabolic Panel:  Recent Labs Lab 04/25/16 0901 04/25/16 0909  NA 135 135  K 4.4 4.4  CL 102 102  CO2 21*  --   GLUCOSE 280* 278*  BUN 16 18  CREATININE 1.27* 1.10  CALCIUM 9.3  --     Lipid Panel:    Component Value Date/Time   CHOL 192 04/26/2016 0407   TRIG 483 (H) 04/26/2016 0407   HDL 20 (L) 04/26/2016 0407   CHOLHDL 9.6 04/26/2016 0407   VLDL UNABLE TO CALCULATE IF TRIGLYCERIDE OVER 400 mg/dL 09/81/191408/25/2017 [id number]   LDLCALC UNABLE TO CALCULATE IF TRIGLYCERIDE OVER 400 mg/dL 56/21/308608/25/2017 [id number]   ONGE9BHgbA1c: No results found for:  HGBA1C Urine Drug Screen:    Component Value Date/Time   LABOPIA NONE DETECTED 04/25/2016 1021   COCAINSCRNUR NONE DETECTED 04/25/2016 1021   LABBENZ NONE DETECTED 04/25/2016 1021   AMPHETMU NONE DETECTED 04/25/2016 1021   THCU NONE DETECTED 04/25/2016 1021   LABBARB NONE DETECTED 04/25/2016 1021      IMAGING  Mr Brain Wo Contrast 04/25/2016 Atrophy and small vessel disease.  No acute intracranial findings. 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 Within limits for assessment on routine brain MR, no evidence for acute or chronic posterior circulation ischemia. No apparent intracranial vascular stenosis or dissection.   Ct Head Code Stroke W/o Cm 04/25/2016 1. No acute cortically based infarct or acute intracranial hemorrhage identified. 2. New lacunar infarct of the right basal ganglia since a neck CT in January is age indeterminate but favored to be chronic. 3. ASPECTS is 10.    PHYSICAL EXAM Pleasant male not in distress. . Afebrile. Head is nontraumatic. Neck is supple without bruit.    Cardiac exam no murmur or gallop. Lungs are clear to auscultation. Distal pulses are well felt.  Neurological Exam ;  Awake  Alert oriented x 3. Normal speech and language.eye movements full without nystagmus.fundi were not visualized. Vision acuity and fields appear normal. Hearing is normal. Palatal movements  are normal. Face symmetric. Tongue midline. Normal strength, tone, reflexes and coordination. Normal sensation. Gait deferred.   ASSESSMENT/PLAN Mr. Samuel Houston is a 70 y.o. 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 He did not receive IV t-PA due to symptoms resolved.   L brain TIA Syncope with documented sinus arrest  MRI  No acute stroke  Complete stroke workup - added carotid doppler, 2D echo and TCD to look at vasculature  LDL unable  to calculate  [id number] pending  Lovenox 40 mg sq daily for VTE prophylaxis  Diet clear liquid Room service appropriate? Yes; Fluid consistency: Thin  Diet NPO time specified Except for: Sips with Meds  aspirin 81 mg daily prior to admission, now on aspirin 81 mg daily  Patient counseled to be compliant with his antithrombotic medications  For cardiac cath today, PPM if no ischemia  Therapy recommendations:  pending   Disposition:  pending   Hypertension  Stable  Long-term BP goal normotensive  Hyperlipidemia  Home meds:  lipitor 80  Resume in hospital when able  LDL unable to calculate, goal < 70  Continue statin at discharge  Diabetes, type II  HgbA1c pending , goal < 7.0   Other Stroke Risk Factors  Advanced age  Former Cigarette smoker  Coronary artery disease s/p CABG  Other Active Problems  Sinus pause  Hospital day # 1  BIBY,SHARON  [facility] See Amion for Pager information 04/26/2016 1:49 PM  I have personally examined this patient, reviewed notes, independently viewed imaging studies, participated in medical decision making and plan of care. I have made any additions or clarifications directly to the above note. Agree with note above.  He presented with left brain TIA in the setting of hypotension from syncope and remains at risk for recurrent stroke, TIA needs ongoing stroke evaluation. Greater than 50% time during this 25 minute visit was spent on counseling and coordination of care about TIA risk, prevention and treatment Delia Heady, MD Medical Director [facility] Pager: [phone] 04/26/2016 2:04 PM    To contact Stroke Continuity provider, please refer to [email or web address]. After hours, contact General Neurology

## 2016-04-27 ENCOUNTER — Inpatient Hospital Stay (HOSPITAL_COMMUNITY): Payer: Medicare HMO

## 2016-04-27 ENCOUNTER — Encounter (HOSPITAL_COMMUNITY): Payer: Self-pay | Admitting: Physician Assistant

## 2016-04-27 DIAGNOSIS — I6789 Other cerebrovascular disease: Secondary | ICD-10-CM

## 2016-04-27 DIAGNOSIS — Z950 Presence of cardiac pacemaker: Secondary | ICD-10-CM

## 2016-04-27 DIAGNOSIS — I6521 Occlusion and stenosis of right carotid artery: Secondary | ICD-10-CM

## 2016-04-27 DIAGNOSIS — G459 Transient cerebral ischemic attack, unspecified: Secondary | ICD-10-CM

## 2016-04-27 LAB — BASIC METABOLIC PANEL
ANION GAP: 11 (ref 5–15)
BUN: 13 mg/dL (ref 6–20)
CO2: 22 mmol/L (ref 22–32)
Calcium: 9.4 mg/dL (ref 8.9–10.3)
Chloride: 101 mmol/L (ref 101–111)
Creatinine, Ser: 1.06 mg/dL (ref 0.61–1.24)
GFR calc Af Amer: >60 mL/min (ref >60)
GLUCOSE: 295 mg/dL — AB (ref 65–99)
POTASSIUM: 4.5 mmol/L (ref 3.5–5.1)
Sodium: 134 mmol/L — ABNORMAL LOW (ref 135–145)

## 2016-04-27 LAB — GLUCOSE, CAPILLARY
GLUCOSE-CAPILLARY: 295 mg/dL — AB (ref 65–99)
Glucose-Capillary: 289 mg/dL — ABNORMAL HIGH (ref 65–99)

## 2016-04-27 LAB — HEMOGLOBIN A1C
Hgb A1c MFr Bld: 7.9 % — ABNORMAL HIGH (ref 4.8–5.6)
MEAN PLASMA GLUCOSE: 180 mg/dL

## 2016-04-27 LAB — ECHOCARDIOGRAM COMPLETE
HEIGHTINCHES: 68 in
WEIGHTICAEL: 2816 [oz_av]

## 2016-04-27 MED ORDER — CLOPIDOGREL BISULFATE 75 MG PO TABS: 75.0000 mg | ORAL_TABLET | Freq: Every day | ORAL | Status: DC

## 2016-04-27 MED ORDER — ASPIRIN EC 81 MG PO TBEC: 81.0000 mg | DELAYED_RELEASE_TABLET | Freq: Every day | ORAL | Status: DC

## 2016-04-27 MED ORDER — CLOPIDOGREL BISULFATE 75 MG PO TABS: 75.0000 mg | ORAL_TABLET | Freq: Every day | ORAL | 11 refills | Status: DC

## 2016-04-27 NOTE — Discharge Summary (Signed)
Discharge Summary    Patient ID: Samuel Houston,  MRN: [id number], DOB/AGE: Jun 20, 1946 70 y.o.  Admit date: 04/25/2016 Discharge date: 04/27/2016  Primary Care Provider: No primary care provider on file. Primary Cardiologist: Dr Rhona Leavens in [facility]  Discharge Diagnoses    Principal Problem:   Near syncope Active Problems:   Dizziness   CAD (coronary artery disease)   Sinus pause   Allergies Allergies  Allergen Reactions  . Ascorbate     Other reaction(s): Other Causes sore in mouth and aggregates esophagus  . Penicillins     Penicillins results in rash    Diagnostic Studies/Procedures    Cardiac Cath: 08/25  Severe native three vessel CAD.  Mid LAD lesion, 100 %stenosed. Patent LIMA to LAD.  100% diagonal occlusion. Patent SVG to diagonal.  Prox Cx to Mid Cx lesion, 100 %stenosed. Patent SVG to OM.  Prox RCA to Mid RCA lesion, 100 %stenosed. Patent SVG to PDA.  The left ventricular systolic function is normal.  LV end diastolic pressure is normal.  The left ventricular ejection fraction is 55-65% by visual estimate.  There is no aortic valve stenosis.  Continue medical therapy.  Plan for pacer.  Pacemaker implantation.  Device Placement: [employer] [facility] Assurity MRI  model [id number] (serial number  [id number] ) pacemaker on Left.  CONCLUSIONS:   1. Successful implantation of a [employer] Assurity MRI  dual-chamber pacemaker for symptomatic bradycardia  2. No early apparent complications.   **Radiology procedures and tests listed below** _____________   History of Present Illness     70 y.o.malewith a PMHx of CAD , CABG was admitted to [facility] on 8/24/2017for evaluation of near syncope  Hospital Course     Consultants: EP, IM  Samuel Houston had headache on the day of admission which reminded him of his previous angina. He was in the bathroom and became lightheaded, hot and diaphoretic. He had near syncope. His wife heard him and went to check on  him. His speech was slurred and she thought he was having a stroke so she called EMS and he came to [facility].  He was initially seen by internal medicine. In the emergency room, he had a sinus pause of greater than 5 seconds. This re-created his symptoms. Cardiology saw the patient and it was felt that he needed an ischemic evaluation and discontinuation of his beta blocker. If these 2 things are unrevealing, EP to see.  He had cardiac catheterization on 04/26/2016, and there were no new areas of ischemia. Medical therapy for CAD was recommended and an EP consult was called.  He was seen by [name] who evaluated the data and felt a permanent pacemaker was indicated. A [employer] pacemaker was inserted on 08/25, he tolerated the procedure well.  He was seen by neurology because of the intermittent slurred speech. A head CT and MRI did not show any acute CVA. His symptoms resolved. 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 He was started on Plavis by Neurology.  On 08/26, he was seen by Dr. Ladona Ridgel and all data were reviewed. His echocardiogram has been performed, results pending. His chest x-ray and interrogation were good. He was having no neurologic symptoms, and no ischemic symptoms. No further inpatient workup was indicated and he is considered stable for discharge, to follow up as an outpatient. Mr Rashid prefers to f/u with Dr Rhona Leavens and let him arrange EP followup. Dr  Camnitz is to see Mr Carder as needed.  _____________  Discharge Vitals Blood pressure 140/87, pulse 82, temperature 98.7 F (37.1 C), temperature source Oral, resp. rate 17, height 5\' 8"  (1.727 m), weight 176 lb (79.8 kg), SpO2 95 %.  Filed Weights   04/25/16 0903 04/25/16 1857 04/26/16 0417  Weight: 181 lb 14.1 oz (82.5 kg) 178 lb (80.7 kg) 176 lb (79.8 kg)    Labs & Radiologic Studies    CBC  Recent Labs  04/25/16 0901 04/25/16 0909 04/26/16 0407  WBC 7.3  --  7.0    NEUTROABS 4.6  --   --   HGB 11.8* 12.6* 12.7*  HCT 36.8* 37.0* 38.9*  MCV 88.9  --  88.8  PLT 127*  --  157   Basic Metabolic Panel  Recent Labs  04/26/16 0837 04/27/16 0503  NA 133* 134*  K 4.3 4.5  CL 98* 101  CO2 22 22  GLUCOSE 330* 295*  BUN 14 13  CREATININE 1.21 1.06  CALCIUM 9.7 9.4   Liver Function Tests  Recent Labs  04/25/16 0901  AST 39  ALT 57  ALKPHOS 21*  BILITOT 0.7  PROT 6.7  ALBUMIN 3.7   Cardiac Enzymes  Recent Labs  04/25/16 1136 04/25/16 1749 04/25/16 2256  TROPONINI <0.03 <0.03 <0.03   Hemoglobin A1C  Recent Labs  04/26/16 0406  HGBA1C 7.9*   Fasting Lipid Panel  Recent Labs  04/26/16 0407  CHOL 192  HDL 20*  LDLCALC UNABLE TO CALCULATE IF TRIGLYCERIDE OVER 400 mg/dL  TRIG 130*  CHOLHDL 9.6   _____________  Dg Chest 2 View Result Date: 04/27/2016 CLINICAL DATA:  Pacer placement EXAM: CHEST  2 VIEW COMPARISON:  08/31/2015 FINDINGS: Left pacer is in place with leads in the right atrium and right ventricle. Prior CABG. Heart is normal size. Lungs are clear. No effusions or pneumothorax. No acute bony abnormality. IMPRESSION: Left pacer placement.  No acute findings. Electronically Signed   By: Charlett Nose M.D.   On: 04/27/2016 08:03   Ct Angio Neck W Or Wo Contrast Result Date: 04/27/2016 CLINICAL DATA:  New RIGHT basal ganglia lacunar infarct. Follow-up evaluation. History of hypertension and diabetes. EXAM: CT ANGIOGRAPHY HEAD AND NECK TECHNIQUE: Multidetector CT imaging of the head and neck was performed using the standard protocol during bolus administration of intravenous contrast. Multiplanar CT image reconstructions and MIPs were obtained to evaluate the vascular anatomy. Carotid stenosis measurements (when applicable) are obtained utilizing NASCET criteria, using the distal internal carotid diameter as the denominator. CONTRAST:  60 cc Isovue 370 COMPARISON:  MRI head April 25, 2016 and CT HEAD April 25, 2016 FINDINGS:  CT HEAD BRAIN: No intraparenchymal hemorrhage, mass effect nor midline shift. RIGHT basal ganglia lacunar infarct with mild ex vacuo dilatation RIGHT frontal horn of the lateral ventricle. Ventricles and sulci are otherwise normal for patient's age. Patchy supratentorial white matter hypodensities within normal range for patient's age, though non-specific are most compatible with chronic small vessel ischemic disease. No acute large vascular territory infarcts. No abnormal extra-axial fluid collections. Basal cisterns are patent. VASCULAR: Moderate calcific atherosclerosis of the carotid siphons. SKULL: No skull fracture. No significant scalp soft tissue swelling. Torus palatini. SINUSES/ORBITS: The included ocular globes and orbital contents are non-suspicious.Bilateral maxillary sinus mucosal retention cyst. Minimal RIGHT sphenoid mucosal thickening. Mastoid air cells are well aerated. OTHER: None. CTA NECK AORTIC ARCH: Normal appearance of the thoracic arch, normal branch pattern. The origins of the innominate, left Common carotid  artery and subclavian artery are widely patent. Circumferential intimal thickening calcific atherosclerosis LEFT proximal subclavian artery. RIGHT CAROTID SYSTEM: Common carotid artery is widely patent, coursing in a straight line fashion. Calcific atherosclerosis results in approximate 75% stenosis RIGHT internal carotid artery origin by NASCET criteria. Mild luminal irregularity compatible with atherosclerosis. LEFT CAROTID SYSTEM: Common carotid artery is widely patent, coursing in a straight line fashion. Normal appearance of the carotid bifurcation without hemodynamically significant stenosis by NASCET criteria, mild eccentric calcific atherosclerosis. Mild luminal irregularity compatible with atherosclerosis. VERTEBRAL ARTERIES:Severe stenosis RIGHT vertebral artery origin, mild stenosis LEFT vertebral artery origin. Extrinsic deformity due to degenerative cervical spine.  Bilateral vertebral arteries are patent with mild luminal irregularity compatible with atherosclerosis. SKELETON: No acute osseous process though bone windows have not been submitted. Status post median sternotomy. LEFT cardiac pacemaker, subcutaneous gas most compatible with recent placement. Coarse calcified stylohyoid ligaments. Poor dentition with maxillary periapical lucency/abscess. Moderate C3-4 thru C6-7 degenerative discs and severe neural foraminal narrowing. OTHER NECK: Soft tissues of the neck are non-acute though, not tailored for evaluation. CTA HEAD ANTERIOR CIRCULATION: Normal appearance of the cervical internal carotid arteries, petrous, cavernous and supra clinoid internal carotid arteries. Mild stenosis LEFT supraclinoid internal carotid artery. Widely patent anterior communicating artery. Severe stenosis RIGHT M3 segment origin. Mild stenosis proximal RIGHT M2 segment. POSTERIOR CIRCULATION: Patent vertebral arteries, vertebrobasilar junction and basilar artery, as well as main branch vessels. RIGHT vertebral artery terminates in the posterior inferior cerebellar artery. Mild luminal irregularity of the basilar artery. High-grade stenosis RIGHT P3 segment origin. Bilateral posterior artery's are otherwise widely patent. VENOUS SINUSES: Major dural venous sinuses are patent though not tailored for evaluation on this angiographic examination. ANATOMIC VARIANTS: None. DELAYED PHASE: No abnormal intracranial enhancement. IMPRESSION: CT HEAD: No acute intracranial process. The RIGHT basal ganglia lacunar infarct. Mild to moderate chronic small vessel ischemic disease. CTA NECK: Approximate 75% stenosis RIGHT internal carotid artery origin. Severe stenosis RIGHT vertebral artery origin, mild stenosis LEFT vertebral artery origin. Generalized luminal irregularity of the neck vessels compatible with atherosclerosis. CTA HEAD: No emergent large vessel occlusion. Severe stenosis RIGHT M3 origin and RIGHT  P3 origin. Electronically Signed   By: Awilda Metroourtnay  Bloomer M.D.   On: 04/27/2016 03:54   Mr Brain Wo Contrast Result Date: 04/25/2016 CLINICAL DATA:  Near syncopal episode, with diaphoresis. Slurred speech and headache. Exam now normal. EXAM: MRI HEAD WITHOUT CONTRAST TECHNIQUE: Multiplanar, multiecho pulse sequences of the brain and surrounding structures were obtained without intravenous contrast. COMPARISON:  CT head earlier today. FINDINGS: No evidence for acute infarction, hemorrhage, mass lesion, hydrocephalus, or extra-axial fluid. Mild atrophy, not unexpected for age. Mild subcortical and periventricular T2 and FLAIR hyperintensities, likely chronic microvascular ischemic change. Chronic lenticulostriate lacunar infarct affects the RIGHT lentiform nucleus and surrounding white matter. Flow voids are maintained throughout the carotid, basilar, and vertebral arteries. There are no areas of chronic hemorrhage. Normal pituitary and cerebellar tonsils. Cervical spondylosis with disc space narrowing, and osseous spurring at the C3-4 level resulting in apparent spinal stenosis. Visualized calvarium, and skull base unremarkable. Scalp and extracranial soft tissues, orbits, sinuses, and mastoids show no acute process. Chronic sinus disease, most notable LEFT maxillary region. IMPRESSION: Atrophy and small vessel disease.  No acute intracranial findings. 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 Within limits for assessment on routine brain MR, no evidence for acute or chronic posterior circulation ischemia. No apparent intracranial vascular stenosis or dissection. Electronically Signed   By: Dale DurhamJohn T Curnes M.D.  On: 04/25/2016 15:43   Ct Head Code Stroke W/o Cm Result Date: 04/25/2016 CLINICAL DATA:  Code stroke. 70 year old male with slurred speech and headache. Initial encounter. EXAM: CT HEAD WITHOUT CONTRAST TECHNIQUE: Contiguous axial images were obtained from the base of the skull  through the vertex without intravenous contrast. COMPARISON:  Neck CT 09/21/2015. FINDINGS: Visualized paranasal sinuses and mastoids are stable and well pneumatized. No acute osseous abnormality identified. Visualized orbits and scalp soft tissues are within normal limits. Calcified atherosclerosis at the skull base. No suspicious intracranial vascular hyperdensity. No cortically based acute infarct identified. ASPECTS [facility] Stroke Program Early CT Score) Total score (0-10 with 10 being normal): 10 Chronic appearing right basal ganglia lacunar infarct is new since January. Associated right anterior frontal lobe white matter hypodensity about the frontal horn. Elsewhere gray-white matter differentiation is within normal limits. No acute intracranial hemorrhage identified. IMPRESSION: 1. No acute cortically based infarct or acute intracranial hemorrhage identified. 2. New lacunar infarct of the right basal ganglia since a neck CT in January is age indeterminate but favored to be chronic. 3. ASPECTS is 10. 4. The above information was relayed via text page to Dr. Rose Fillers at [phone] hours. Electronically Signed   By: Odessa Fleming M.D.   On: 04/25/2016 09:22   Disposition   Pt is being discharged home today in good condition.  Follow-up Plans & Appointments    Follow-up Information    [name], MD .   Specialty:  Cardiology Why:  As needed. Contact information: [address] [place] [address] [phone]        Holley Raring, MD .   Specialty:  Cardiology Why:  See within 2 weeks for wound check and device check. Restart metoprolol per Dr Rhona Leavens. Contact information: [address] [place] [address] [phone]          Discharge Instructions    Diet - low sodium heart healthy    Complete by:  As directed   Diet Carb Modified    Complete by:  As directed   Increase activity slowly    Complete by:  As directed      Discharge Medications    Current Discharge Medication List    START taking these medications   Details  clopidogrel (PLAVIX) 75 MG tablet Take 1 tablet (75 mg total) by mouth daily. Qty: 30 tablet, Refills: 11      CONTINUE these medications which have NOT CHANGED   Details  aspirin 81 MG tablet Take 81 mg by mouth daily.    DOCOSAHEXAENOIC ACID PO Take 2 capsules by mouth daily.    fenofibrate 160 MG tablet Take 160 mg by mouth daily.    ferrous sulfate 325 (65 FE) MG EC tablet Take 325 mg by mouth daily.    folic acid (FOLVITE) 1 MG tablet Take 1 mg by mouth daily.    glipiZIDE (GLUCOTROL) 10 MG tablet Take 10 mg by mouth daily.    ibuprofen (ADVIL,MOTRIN) 200 MG tablet Take 200 mg by mouth every 6 (six) hours as needed for moderate pain.    lisinopril (PRINIVIL,ZESTRIL) 2.5 MG tablet Take 2.5 mg by mouth daily.     metFORMIN (GLUCOPHAGE) 1000 MG tablet Take 1,000 mg by mouth 2 (two) times daily.    Multiple Vitamins-Minerals (ZINC PO) Take 1 tablet by mouth daily.    omeprazole (PRILOSEC) 40 MG capsule Take 40 mg by mouth daily.    saw palmetto 80  MG capsule Take 80 mg by mouth 2 (two) times daily.    tamsulosin (FLOMAX) 0.4 MG CAPS capsule Take 0.4 mg by mouth daily.      STOP taking these medications     metoprolol tartrate (LOPRESSOR) 25 MG tablet           Outstanding Labs/Studies   ECHO  Duration of Discharge Encounter   Greater than 30 minutes including physician time.  Melida Quitter NP 04/27/2016, 2:33 PM   EP Attending  Patient seen and examined. Agree with above. Ok for discharge.   Leonia Reeves.D.

## 2016-04-27 NOTE — Progress Notes (Signed)
Patient ID: Samuel Houston, male   DOB: [birth year], 70 y.o.   MRN: [id number]    Patient Name: Samuel Houston Date of Encounter: 04/27/2016     Principal Problem:   Near syncope Active Problems:   Dizziness   CAD (coronary artery disease)   Sinus pause    SUBJECTIVE  Feels well. No chest pain or sob.  CURRENT MEDS .  stroke: mapping our early stages of recovery book   Does not apply Once  . aspirin EC  81 mg Oral Daily  . chlorhexidine  15 mL Mouth Rinse BID  . Chlorhexidine Gluconate Cloth  6 each Topical Q0600  . enoxaparin (LOVENOX) injection  40 mg Subcutaneous Q24H  . ferrous sulfate  325 mg Oral Daily  . insulin aspart  0-20 Units Subcutaneous TID WC  . lisinopril  2.5 mg Oral Daily  . mupirocin ointment  1 application Nasal BID  . pantoprazole  40 mg Oral Daily  . sodium chloride flush  3 mL Intravenous Q12H    OBJECTIVE  Vitals:   04/26/16 1825 04/26/16 2042 04/26/16 2343 04/27/16 0617  BP: (!) 157/93 (!) 141/83 (!) 159/85 140/87  Pulse:  87 83 82  Resp:  18 18 17   Temp:  98.6 F (37 C) 99.1 F (37.3 C) 98.7 F (37.1 C)  TempSrc:  Oral Oral Oral  SpO2: 96% 100% 95% 95%  Weight:      Height:        Intake/Output Summary (Last 24 hours) at 04/27/16 1217 Last data filed at 04/27/16 0400  Gross per 24 hour  Intake              200 ml  Output              775 ml  Net             -575 ml   Filed Weights   04/25/16 0903 04/25/16 1857 04/26/16 0417  Weight: 181 lb 14.1 oz (82.5 kg) 178 lb (80.7 kg) 176 lb (79.8 kg)    PHYSICAL EXAM  General: Pleasant, NAD. Neuro: Alert and oriented X 3. Moves all extremities spontaneously. Psych: Normal affect. HEENT:  Normal  Neck: Supple without bruits or JVD. Lungs:  Resp regular and unlabored, CTA. No hematoma. Heart: RRR no s3, s4, or murmurs. Abdomen: Soft, non-tender, non-distended, BS + x 4.  Extremities: No clubbing, cyanosis or edema. DP/PT/Radials 2+ and equal bilaterally.  Accessory  Clinical Findings  CBC  Recent Labs  04/25/16 0901 04/25/16 0909 04/26/16 0407  WBC 7.3  --  7.0  NEUTROABS 4.6  --   --   HGB 11.8* 12.6* 12.7*  HCT 36.8* 37.0* 38.9*  MCV 88.9  --  88.8  PLT 127*  --  157   Basic Metabolic Panel  Recent Labs  04/26/16 0837 04/27/16 0503  NA 133* 134*  K 4.3 4.5  CL 98* 101  CO2 22 22  GLUCOSE 330* 295*  BUN 14 13  CREATININE 1.21 1.06  CALCIUM 9.7 9.4   Liver Function Tests  Recent Labs  04/25/16 0901  AST 39  ALT 57  ALKPHOS 21*  BILITOT 0.7  PROT 6.7  ALBUMIN 3.7   No results for input(s): LIPASE, AMYLASE in the last 72 hours. Cardiac Enzymes  Recent Labs  04/25/16 1136 04/25/16 1749 04/25/16 2256  TROPONINI <0.03 <0.03 <0.03   BNP Invalid input(s): POCBNP D-Dimer No results for input(s): DDIMER in the last 72 hours. Hemoglobin A1C  Recent Labs  04/26/16 0406  HGBA1C 7.9*   Fasting Lipid Panel  Recent Labs  04/26/16 0407  CHOL 192  HDL 20*  LDLCALC UNABLE TO CALCULATE IF TRIGLYCERIDE OVER 400 mg/dL  TRIG 161*  CHOLHDL 9.6   Thyroid Function Tests No results for input(s): TSH, T4TOTAL, T3FREE, THYROIDAB in the last 72 hours.  Invalid input(s): FREET3  TELE  nsr  Radiology/Studies  Ct Angio Head W Or Wo Contrast  Result Date: 04/27/2016 CLINICAL DATA:  New RIGHT basal ganglia lacunar infarct. Follow-up evaluation. History of hypertension and diabetes. EXAM: CT ANGIOGRAPHY HEAD AND NECK TECHNIQUE: Multidetector CT imaging of the head and neck was performed using the standard protocol during bolus administration of intravenous contrast. Multiplanar CT image reconstructions and MIPs were obtained to evaluate the vascular anatomy. Carotid stenosis measurements (when applicable) are obtained utilizing NASCET criteria, using the distal internal carotid diameter as the denominator. CONTRAST:  60 cc Isovue 370 COMPARISON:  MRI head April 25, 2016 and CT HEAD April 25, 2016 FINDINGS: CT HEAD BRAIN: No  intraparenchymal hemorrhage, mass effect nor midline shift. RIGHT basal ganglia lacunar infarct with mild ex vacuo dilatation RIGHT frontal horn of the lateral ventricle. Ventricles and sulci are otherwise normal for patient's age. Patchy supratentorial white matter hypodensities within normal range for patient's age, though non-specific are most compatible with chronic small vessel ischemic disease. No acute large vascular territory infarcts. No abnormal extra-axial fluid collections. Basal cisterns are patent. VASCULAR: Moderate calcific atherosclerosis of the carotid siphons. SKULL: No skull fracture. No significant scalp soft tissue swelling. Torus palatini. SINUSES/ORBITS: The included ocular globes and orbital contents are non-suspicious.Bilateral maxillary sinus mucosal retention cyst. Minimal RIGHT sphenoid mucosal thickening. Mastoid air cells are well aerated. OTHER: None. CTA NECK AORTIC ARCH: Normal appearance of the thoracic arch, normal branch pattern. The origins of the innominate, left Common carotid artery and subclavian artery are widely patent. Circumferential intimal thickening calcific atherosclerosis LEFT proximal subclavian artery. RIGHT CAROTID SYSTEM: Common carotid artery is widely patent, coursing in a straight line fashion. Calcific atherosclerosis results in approximate 75% stenosis RIGHT internal carotid artery origin by NASCET criteria. Mild luminal irregularity compatible with atherosclerosis. LEFT CAROTID SYSTEM: Common carotid artery is widely patent, coursing in a straight line fashion. Normal appearance of the carotid bifurcation without hemodynamically significant stenosis by NASCET criteria, mild eccentric calcific atherosclerosis. Mild luminal irregularity compatible with atherosclerosis. VERTEBRAL ARTERIES:Severe stenosis RIGHT vertebral artery origin, mild stenosis LEFT vertebral artery origin. Extrinsic deformity due to degenerative cervical spine. Bilateral vertebral  arteries are patent with mild luminal irregularity compatible with atherosclerosis. SKELETON: No acute osseous process though bone windows have not been submitted. Status post median sternotomy. LEFT cardiac pacemaker, subcutaneous gas most compatible with recent placement. Coarse calcified stylohyoid ligaments. Poor dentition with maxillary periapical lucency/abscess. Moderate C3-4 thru C6-7 degenerative discs and severe neural foraminal narrowing. OTHER NECK: Soft tissues of the neck are non-acute though, not tailored for evaluation. CTA HEAD ANTERIOR CIRCULATION: Normal appearance of the cervical internal carotid arteries, petrous, cavernous and supra clinoid internal carotid arteries. Mild stenosis LEFT supraclinoid internal carotid artery. Widely patent anterior communicating artery. Severe stenosis RIGHT M3 segment origin. Mild stenosis proximal RIGHT M2 segment. POSTERIOR CIRCULATION: Patent vertebral arteries, vertebrobasilar junction and basilar artery, as well as main branch vessels. RIGHT vertebral artery terminates in the posterior inferior cerebellar artery. Mild luminal irregularity of the basilar artery. High-grade stenosis RIGHT P3 segment origin. Bilateral posterior artery's are otherwise widely patent. VENOUS SINUSES: Major dural venous sinuses  are patent though not tailored for evaluation on this angiographic examination. ANATOMIC VARIANTS: None. DELAYED PHASE: No abnormal intracranial enhancement. IMPRESSION: CT HEAD: No acute intracranial process. The RIGHT basal ganglia lacunar infarct. Mild to moderate chronic small vessel ischemic disease. CTA NECK: Approximate 75% stenosis RIGHT internal carotid artery origin. Severe stenosis RIGHT vertebral artery origin, mild stenosis LEFT vertebral artery origin. Generalized luminal irregularity of the neck vessels compatible with atherosclerosis. CTA HEAD: No emergent large vessel occlusion. Severe stenosis RIGHT M3 origin and RIGHT P3 origin.  Electronically Signed   By: Awilda Metro M.D.   On: 04/27/2016 03:54   Dg Chest 2 View  Result Date: 04/27/2016 CLINICAL DATA:  Pacer placement EXAM: CHEST  2 VIEW COMPARISON:  08/31/2015 FINDINGS: Left pacer is in place with leads in the right atrium and right ventricle. Prior CABG. Heart is normal size. Lungs are clear. No effusions or pneumothorax. No acute bony abnormality. IMPRESSION: Left pacer placement.  No acute findings. Electronically Signed   By: Charlett Nose M.D.   On: 04/27/2016 08:03   Ct Angio Neck W Or Wo Contrast  Result Date: 04/27/2016 CLINICAL DATA:  New RIGHT basal ganglia lacunar infarct. Follow-up evaluation. History of hypertension and diabetes. EXAM: CT ANGIOGRAPHY HEAD AND NECK TECHNIQUE: Multidetector CT imaging of the head and neck was performed using the standard protocol during bolus administration of intravenous contrast. Multiplanar CT image reconstructions and MIPs were obtained to evaluate the vascular anatomy. Carotid stenosis measurements (when applicable) are obtained utilizing NASCET criteria, using the distal internal carotid diameter as the denominator. CONTRAST:  60 cc Isovue 370 COMPARISON:  MRI head April 25, 2016 and CT HEAD April 25, 2016 FINDINGS: CT HEAD BRAIN: No intraparenchymal hemorrhage, mass effect nor midline shift. RIGHT basal ganglia lacunar infarct with mild ex vacuo dilatation RIGHT frontal horn of the lateral ventricle. Ventricles and sulci are otherwise normal for patient's age. Patchy supratentorial white matter hypodensities within normal range for patient's age, though non-specific are most compatible with chronic small vessel ischemic disease. No acute large vascular territory infarcts. No abnormal extra-axial fluid collections. Basal cisterns are patent. VASCULAR: Moderate calcific atherosclerosis of the carotid siphons. SKULL: No skull fracture. No significant scalp soft tissue swelling. Torus palatini. SINUSES/ORBITS: The included  ocular globes and orbital contents are non-suspicious.Bilateral maxillary sinus mucosal retention cyst. Minimal RIGHT sphenoid mucosal thickening. Mastoid air cells are well aerated. OTHER: None. CTA NECK AORTIC ARCH: Normal appearance of the thoracic arch, normal branch pattern. The origins of the innominate, left Common carotid artery and subclavian artery are widely patent. Circumferential intimal thickening calcific atherosclerosis LEFT proximal subclavian artery. RIGHT CAROTID SYSTEM: Common carotid artery is widely patent, coursing in a straight line fashion. Calcific atherosclerosis results in approximate 75% stenosis RIGHT internal carotid artery origin by NASCET criteria. Mild luminal irregularity compatible with atherosclerosis. LEFT CAROTID SYSTEM: Common carotid artery is widely patent, coursing in a straight line fashion. Normal appearance of the carotid bifurcation without hemodynamically significant stenosis by NASCET criteria, mild eccentric calcific atherosclerosis. Mild luminal irregularity compatible with atherosclerosis. VERTEBRAL ARTERIES:Severe stenosis RIGHT vertebral artery origin, mild stenosis LEFT vertebral artery origin. Extrinsic deformity due to degenerative cervical spine. Bilateral vertebral arteries are patent with mild luminal irregularity compatible with atherosclerosis. SKELETON: No acute osseous process though bone windows have not been submitted. Status post median sternotomy. LEFT cardiac pacemaker, subcutaneous gas most compatible with recent placement. Coarse calcified stylohyoid ligaments. Poor dentition with maxillary periapical lucency/abscess. Moderate C3-4 thru C6-7 degenerative discs and severe neural  foraminal narrowing. OTHER NECK: Soft tissues of the neck are non-acute though, not tailored for evaluation. CTA HEAD ANTERIOR CIRCULATION: Normal appearance of the cervical internal carotid arteries, petrous, cavernous and supra clinoid internal carotid arteries. Mild  stenosis LEFT supraclinoid internal carotid artery. Widely patent anterior communicating artery. Severe stenosis RIGHT M3 segment origin. Mild stenosis proximal RIGHT M2 segment. POSTERIOR CIRCULATION: Patent vertebral arteries, vertebrobasilar junction and basilar artery, as well as main branch vessels. RIGHT vertebral artery terminates in the posterior inferior cerebellar artery. Mild luminal irregularity of the basilar artery. High-grade stenosis RIGHT P3 segment origin. Bilateral posterior artery's are otherwise widely patent. VENOUS SINUSES: Major dural venous sinuses are patent though not tailored for evaluation on this angiographic examination. ANATOMIC VARIANTS: None. DELAYED PHASE: No abnormal intracranial enhancement. IMPRESSION: CT HEAD: No acute intracranial process. The RIGHT basal ganglia lacunar infarct. Mild to moderate chronic small vessel ischemic disease. CTA NECK: Approximate 75% stenosis RIGHT internal carotid artery origin. Severe stenosis RIGHT vertebral artery origin, mild stenosis LEFT vertebral artery origin. Generalized luminal irregularity of the neck vessels compatible with atherosclerosis. CTA HEAD: No emergent large vessel occlusion. Severe stenosis RIGHT M3 origin and RIGHT P3 origin. Electronically Signed   By: Awilda Metro M.D.   On: 04/27/2016 03:54   Mr Brain Wo Contrast  Result Date: 04/25/2016 CLINICAL DATA:  Near syncopal episode, with diaphoresis. Slurred speech and headache. Exam now normal. EXAM: MRI HEAD WITHOUT CONTRAST TECHNIQUE: Multiplanar, multiecho pulse sequences of the brain and surrounding structures were obtained without intravenous contrast. COMPARISON:  CT head earlier today. FINDINGS: No evidence for acute infarction, hemorrhage, mass lesion, hydrocephalus, or extra-axial fluid. Mild atrophy, not unexpected for age. Mild subcortical and periventricular T2 and FLAIR hyperintensities, likely chronic microvascular ischemic change. Chronic lenticulostriate  lacunar infarct affects the RIGHT lentiform nucleus and surrounding white matter. Flow voids are maintained throughout the carotid, basilar, and vertebral arteries. There are no areas of chronic hemorrhage. Normal pituitary and cerebellar tonsils. Cervical spondylosis with disc space narrowing, and osseous spurring at the C3-4 level resulting in apparent spinal stenosis. Visualized calvarium, and skull base unremarkable. Scalp and extracranial soft tissues, orbits, sinuses, and mastoids show no acute process. Chronic sinus disease, most notable LEFT maxillary region. IMPRESSION: Atrophy and small vessel disease.  No acute intracranial findings. 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 Within limits for assessment on routine brain MR, no evidence for acute or chronic posterior circulation ischemia. No apparent intracranial vascular stenosis or dissection. Electronically Signed   By: Elsie Stain M.D.   On: 04/25/2016 15:43   Ct Head Code Stroke W/o Cm  Result Date: 04/25/2016 CLINICAL DATA:  Code stroke. 70 year old male with slurred speech and headache. Initial encounter. EXAM: CT HEAD WITHOUT CONTRAST TECHNIQUE: Contiguous axial images were obtained from the base of the skull through the vertex without intravenous contrast. COMPARISON:  Neck CT 09/21/2015. FINDINGS: Visualized paranasal sinuses and mastoids are stable and well pneumatized. No acute osseous abnormality identified. Visualized orbits and scalp soft tissues are within normal limits. Calcified atherosclerosis at the skull base. No suspicious intracranial vascular hyperdensity. No cortically based acute infarct identified. ASPECTS [facility] Stroke Program Early CT Score) Total score (0-10 with 10 being normal): 10 Chronic appearing right basal ganglia lacunar infarct is new since January. Associated right anterior frontal lobe white matter hypodensity about the frontal horn. Elsewhere gray-white matter differentiation is  within normal limits. No acute intracranial hemorrhage identified. IMPRESSION: 1. No acute cortically based infarct or acute intracranial hemorrhage identified. 2.  New lacunar infarct of the right basal ganglia since a neck CT in January is age indeterminate but favored to be chronic. 3. ASPECTS is 10. 4. The above information was relayed via text page to Dr. Rose FillersJames Armstrong at [phone] hours. Electronically Signed   By: Odessa FlemingH  Hall M.D.   On: 04/25/2016 09:22    ASSESSMENT AND PLAN  1. Symptomatic bradycardia 2. S/p PPM 3. Cerebrovascular disease Rec: he is stable after PPM and his CXR and interogation are good. He will be discharged home and followup in device clinic in 2 weeks. F/U Dr. Elberta Fortisamnitz in 3 months.  Tamarah Bhullar,M.D.  Tawnia Schirm,M.D.  04/27/2016 12:17 PM

## 2016-04-27 NOTE — Progress Notes (Signed)
  Echocardiogram 2D Echocardiogram has been performed.  Arvil ChacoFoster, Shamanda Len 04/27/2016, 1:40 PM

## 2016-04-27 NOTE — Evaluation (Signed)
Physical Therapy Evaluation Patient Details Name: Samuel BattyKenneth Edward Houston MRN: [id number] DOB: November 21, 1945 Today's Date: 04/27/2016   History of Present Illness  Pt is a 70 y/o M who arrived at ED via ambulance after a near sycopal episode.  While pt was being evaluated in the ED he developed an episode of sinus arrest.  MRI negative for acute abnormality. Pt is now s/p permanent pacemaker placement.  Pt's PMH includes MI, CABG.     Clinical Impression  Pt s/p surgery listed above.  He is at mod I level of mobility and was provided with and reviewed ICD/pacemaker handout.  He will have 24/7 assist/supervision from his wife at d/c.  No skilled PT needs identified.  PT will sign off.    Follow Up Recommendations No PT follow up    Equipment Recommendations  None recommended by PT    Recommendations for Other Services       Precautions / Restrictions Precautions Precautions: ICD/Pacemaker Precaution Comments: Provided pt with and reviewed ICD/Pacemaker handout.   Required Braces or Orthoses: Sling Restrictions Weight Bearing Restrictions: No      Mobility  Bed Mobility Overal bed mobility: Modified Independent             General bed mobility comments: Pt did not need cues to avoid pulling, pushing with Lt UE.  Transfers Overall transfer level: Independent Equipment used: None             General transfer comment: No cues or physical assist.  No imbalance noted.  Ambulation/Gait Ambulation/Gait assistance: Independent Ambulation Distance (Feet): 200 Feet Assistive device: None Gait Pattern/deviations: WFL(Within Functional Limits)   Gait velocity interpretation: at or above normal speed for age/gender General Gait Details: No imbalance noted with higher level activities (head turns, turning, direction changes)  Stairs            Wheelchair Mobility    Modified Rankin (Stroke Patients Only)       Balance Overall balance assessment: No apparent  balance deficits (not formally assessed)                                           Pertinent Vitals/Pain Pain Assessment: No/denies pain    Home Living Family/patient expects to be discharged to:: Private residence Living Arrangements: Spouse/significant other Available Help at Discharge: Family;Available 24 hours/day Type of Home: House Home Access: Stairs to enter Entrance Stairs-Rails: Left;Right;Can reach both Secretary/administratorntrance Stairs-Number of Steps: 2 Home Layout: One level        Prior Function Level of Independence: Independent         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     Hand Dominance        Extremity/Trunk Assessment   Upper Extremity Assessment: LUE deficits/detail       LUE Deficits / Details: did not formally test strength Lt UE due to s/p pacemaker placement   Lower Extremity Assessment: Overall WFL for tasks assessed      Cervical / Trunk Assessment: Normal  Communication   Communication: No difficulties  Cognition Arousal/Alertness: Awake/alert Behavior During Therapy: WFL for tasks assessed/performed Overall Cognitive Status: Within Functional Limits for tasks assessed                      General Comments General comments (skin integrity, edema, etc.): 3 family members in room during evaluation  Exercises        Assessment/Plan    PT Assessment Patent does not need any further PT services  PT Diagnosis Acute pain   PT Problem List    PT Treatment Interventions     PT Goals (Current goals can be found in the Care Plan section) Acute Rehab PT Goals Patient Stated Goal: to go home today PT Goal Formulation: With patient/family Time For Goal Achievement: 05/04/16 Potential to Achieve Goals: Good    Frequency     Barriers to discharge        Co-evaluation               End of Session   Activity Tolerance: Patient tolerated treatment well Patient left: in bed;with call bell/phone within  reach;with family/visitor present;Other (comment) (sitting EOB) Nurse Communication: Mobility status;Precautions         Time: 1353-1410 PT Time Calculation (min) (ACUTE ONLY): 17 min   Charges:   PT Evaluation $PT Eval Low Complexity: 1 Procedure     PT G Codes:        Encarnacion Chu PT, DPT  Pager: [phone] Phone: [phone] 04/27/2016, 2:25 PM

## 2016-04-27 NOTE — Progress Notes (Signed)
STROKE TEAM PROGRESS NOTE   HISTORY OF PRESENT ILLNESS (per record) Samuel Houston is an 70 y.o. male presenting to hospital after suddenly feeling hot, diaphoretic and then started to lean down to the tub but unsure if he passed out. In route he had intermittent slurred speech which has fully resolved. Currently he is asymptomatic. No acute infarct on CT head. Patient was last known well 04/25/2016 at 08:25. Patient was not administered IV t-PA secondary to symptoms resolved. He was admitted for further evaluation and treatment.   SUBJECTIVE (INTERVAL HISTORY) Pt sitting in chair, had pacemaker put in yesterday after cardiac cath. He stated that he felt dizzy, about to pass out initially, but told to have slurry speech but no weakness or numbness or facial droop. Now all resolved. CTA found to have right ICA 75% stenosis, but seems asymptomatic at this time. BP control is the key.    OBJECTIVE Temp:  [98.6 F (37 C)-99.1 F (37.3 C)] 98.7 F (37.1 C) (08/26 0617) Pulse Rate:  [0-111] 82 (08/26 0617) Cardiac Rhythm: Normal sinus rhythm (08/26 0802) Resp:  [0-53] 17 (08/26 0617) BP: (139-193)/(83-110) 140/87 (08/26 0617) SpO2:  [0 %-100 %] 95 % (08/26 0617)  CBC:   Recent Labs Lab 04/25/16 0901 04/25/16 0909 04/26/16 0407  WBC 7.3  --  7.0  NEUTROABS 4.6  --   --   HGB 11.8* 12.6* 12.7*  HCT 36.8* 37.0* 38.9*  MCV 88.9  --  88.8  PLT 127*  --  157    Basic Metabolic Panel:   Recent Labs Lab 04/26/16 0837 04/27/16 0503  NA 133* 134*  K 4.3 4.5  CL 98* 101  CO2 22 22  GLUCOSE 330* 295*  BUN 14 13  CREATININE 1.21 1.06  CALCIUM 9.7 9.4    Lipid Panel:     Component Value Date/Time   CHOL 192 04/26/2016 0407   TRIG 483 (H) 04/26/2016 0407   HDL 20 (L) 04/26/2016 0407   CHOLHDL 9.6 04/26/2016 0407   VLDL UNABLE TO CALCULATE IF TRIGLYCERIDE OVER 400 mg/dL 16/06/9603 5409   LDLCALC UNABLE TO CALCULATE IF TRIGLYCERIDE OVER 400 mg/dL 81/19/1478 2956    OZHY8M:  Lab Results  Component Value Date   HGBA1C 7.9 (H) 04/26/2016   Urine Drug Screen:     Component Value Date/Time   LABOPIA NONE DETECTED 04/25/2016 1021   COCAINSCRNUR NONE DETECTED 04/25/2016 1021   LABBENZ NONE DETECTED 04/25/2016 1021   AMPHETMU NONE DETECTED 04/25/2016 1021   THCU NONE DETECTED 04/25/2016 1021   LABBARB NONE DETECTED 04/25/2016 1021      IMAGING I have personally reviewed the radiological images below and agree with the radiology interpretations.  Mr Brain Wo Contrast 04/25/2016 Atrophy and small vessel disease.  No acute intracranial findings. 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 Within limits for assessment on routine brain MR, no evidence for acute or chronic posterior circulation ischemia. No apparent intracranial vascular stenosis or dissection.   Ct Head Code Stroke W/o Cm 04/25/2016 1. No acute cortically based infarct or acute intracranial hemorrhage identified.  2. New lacunar infarct of the right basal ganglia since a neck CT in January is age indeterminate but favored to be chronic.  3. ASPECTS is 10.   CTA Head and Neck 04/27/2016  CT HEAD:  No acute intracranial process. The RIGHT basal ganglia lacunar infarct. Mild to moderate chronic small vessel ischemic disease.  CTA NECK:  Approximate 75% stenosis RIGHT internal carotid artery origin. Severe  stenosis RIGHT vertebral artery origin,  mild stenosis LEFT vertebral artery origin. Generalized luminal irregularity of the neck vessels compatible with atherosclerosis.  CTA HEAD: No emergent large vessel occlusion. Severe stenosis RIGHT M3 origin and RIGHT P3 origin.  TTE -- Left ventricle: The cavity size was normal. There was severe   concentric hypertrophy. Systolic function was normal. The   estimated ejection fraction was in the range of 60% to 65%. Wall   motion was normal; there were no regional wall motion   abnormalities. Doppler parameters  are consistent with abnormal   left ventricular relaxation (grade 1 diastolic dysfunction).   There was no evidence of elevated ventricular filling pressure by   Doppler parameters. - Aortic valve: Trileaflet; mildly thickened, mildly calcified   leaflets. - Aortic root: The aortic root was normal in size. - Mitral valve: Calcified annulus. Mildly thickened leaflets .   There was mild regurgitation. - Left atrium: The atrium was normal in size. - Right ventricle: Systolic function was normal. - Right atrium: The atrium was normal in size. - Tricuspid valve: There was mild regurgitation. - Pulmonary arteries: Systolic pressure was within the normal   range. - Inferior vena cava: The vessel was normal in size. - Pericardium, extracardiac: There was no pericardial effusion.   PHYSICAL EXAM Pleasant male not in distress. . Afebrile. Head is nontraumatic. Neck is supple without bruit.    Cardiac exam no murmur or gallop. Lungs are clear to auscultation. Distal pulses are well felt.  Neurological Exam ;  Awake  Alert oriented x 3. Normal speech and language.eye movements full without nystagmus.fundi were not visualized. Vision acuity and fields appear normal. Hearing is normal. Palatal movements are normal. Face symmetric. Tongue midline. Normal strength, tone, reflexes and coordination. Normal sensation. Gait deferred.   ASSESSMENT/PLAN Mr. KeAlfredo Battynneth Edward Houston is a 70 y.o. 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 He did not receive IV t-PA due to symptoms resolved.   Left brain TIA likely due to syncope with documented sinus arrest  MRI  No acute stroke  CTA Head & Neck - No acute intracranial process. Approximate 75% stenosis RIGHT internal carotid artery origin. Severe stenosis RIGHT M3 origin and RIGHT P3 origin.  2D echo - 60-65% EF  LDL unable to calculate, due to  high TG  HgbA1c 7.9  Lovenox 40 mg sq daily for VTE prophylaxis Diet heart healthy/carb modified Room service appropriate? Yes; Fluid consistency: Thin  aspirin 81 mg daily prior to admission, now on aspirin 81 mg daily. Recommend switch to plavix for stroke prevention.   Patient counseled to be compliant with his antithrombotic medications  For cardiac cath today, PPM if no ischemia  Therapy recommendations:  pending   Disposition:  pending   Right ICA stenosis  75% stenosis  Asymptomatic now and will need VVS outpt follow up  Recommend ASA and plavix for 3 months and plavix alone.   Hypertension  Stable  BP goal 130-150 due to right ICA stenosis  Hyperlipidemia  Home meds:  lipitor 80  Resume in hospital when able  LDL unable to calculate, goal < 70  Continue statin at discharge  Diabetes, type II  HgbA1c 7.9 , goal < 7.0   Uncontrolled diabetes mellitus  Other Stroke Risk Factors  Advanced age  Former Cigarette smoker  Coronary artery disease s/p CABG  Other Active Problems  Sinus pause -  [employer] Jude Medical Assurity MRI  dual-chamber pacemaker placed 04/26/2016.  Right internal carotid stenosis - needs vascular surgery follow-up.  Hospital day # 2  Neurology will sign off. Please call with questions. Pt will follow up with Dr. Roda Shutters at [facility] in about 2 months. Thanks for the consult.  Marvel Plan, MD PhD Stroke Neurology 04/28/2016 12:13 AM      To contact Stroke Continuity provider, please refer to [email or web address]. After hours, contact General Neurology

## 2016-04-28 ENCOUNTER — Other Ambulatory Visit: Payer: Self-pay | Admitting: Neurology

## 2016-04-28 DIAGNOSIS — I6529 Occlusion and stenosis of unspecified carotid artery: Secondary | ICD-10-CM | POA: Insufficient documentation

## 2016-04-28 DIAGNOSIS — I6521 Occlusion and stenosis of right carotid artery: Secondary | ICD-10-CM

## 2016-04-29 ENCOUNTER — Encounter (HOSPITAL_COMMUNITY): Payer: Self-pay | Admitting: Interventional Cardiology

## 2016-05-01 MED ORDER — IOPAMIDOL (ISOVUE-370) INJECTION 76%
50.0000 mL | Freq: Once | INTRAVENOUS | Status: AC | PRN
  Administered 2016-04-27: 50 mL via INTRAVENOUS

## 2016-05-07 ENCOUNTER — Encounter: Payer: Self-pay | Admitting: Cardiology

## 2016-05-07 ENCOUNTER — Ambulatory Visit (INDEPENDENT_AMBULATORY_CARE_PROVIDER_SITE_OTHER): Payer: Medicare HMO | Admitting: *Deleted

## 2016-05-07 DIAGNOSIS — I455 Other specified heart block: Secondary | ICD-10-CM

## 2016-05-07 LAB — CUP PACEART INCLINIC DEVICE CHECK
Battery Remaining Longevity: 140.4
Brady Statistic RA Percent Paced: 2 %
Brady Statistic RV Percent Paced: 0.08 %
Date Time Interrogation Session: 20170905121311
Implantable Lead Location: 753859
Lead Channel Impedance Value: 600 Ohm
Lead Channel Pacing Threshold Amplitude: 0.75 V
Lead Channel Pacing Threshold Pulse Width: 0.5 ms
Lead Channel Pacing Threshold Pulse Width: 0.5 ms
Lead Channel Sensing Intrinsic Amplitude: 11.9 mV
Lead Channel Setting Pacing Amplitude: 1.625
Lead Channel Setting Sensing Sensitivity: 2 mV
MDC IDC LEAD IMPLANT DT: 20170825
MDC IDC LEAD IMPLANT DT: 20170825
MDC IDC LEAD LOCATION: 753860
MDC IDC MSMT BATTERY VOLTAGE: 3.07 V
MDC IDC MSMT LEADCHNL RA IMPEDANCE VALUE: 487.5 Ohm
MDC IDC MSMT LEADCHNL RA PACING THRESHOLD AMPLITUDE: 0.75 V
MDC IDC MSMT LEADCHNL RA SENSING INTR AMPL: 5 mV
MDC IDC MSMT LEADCHNL RV PACING THRESHOLD AMPLITUDE: 0.5 V
MDC IDC MSMT LEADCHNL RV PACING THRESHOLD AMPLITUDE: 0.5 V
MDC IDC MSMT LEADCHNL RV PACING THRESHOLD PULSEWIDTH: 0.5 ms
MDC IDC MSMT LEADCHNL RV PACING THRESHOLD PULSEWIDTH: 0.5 ms
MDC IDC PG SERIAL: 7941497
MDC IDC SET LEADCHNL RV PACING AMPLITUDE: 0.625
MDC IDC SET LEADCHNL RV PACING PULSEWIDTH: 0.5 ms
Pulse Gen Model: 2272

## 2016-05-07 NOTE — Progress Notes (Signed)
Wound check appointment. Steri-strips removed prior to appt. Wound without redness or edema. Incision edges approximated, wound well healed. Normal device function. Thresholds, sensing, and impedances consistent with implant measurements. RA/RV auto capture programmed on at implant. Histogram distribution appropriate for patient and level of activity. No mode switches or high ventricular rates noted. Patient educated about wound care, arm mobility, lifting restrictions. ROV in 3 months with WC.

## 2016-08-07 ENCOUNTER — Ambulatory Visit (INDEPENDENT_AMBULATORY_CARE_PROVIDER_SITE_OTHER): Payer: Medicare HMO | Admitting: Cardiology

## 2016-08-07 ENCOUNTER — Encounter: Payer: Medicare HMO | Admitting: Cardiology

## 2016-08-07 ENCOUNTER — Encounter: Payer: Self-pay | Admitting: Cardiology

## 2016-08-07 VITALS — BP 156/90 | HR 66 | Ht 68.0 in | Wt 185.1 lb

## 2016-08-07 DIAGNOSIS — I455 Other specified heart block: Secondary | ICD-10-CM

## 2016-08-07 NOTE — Progress Notes (Signed)
Electrophysiology Office Note   Date:  08/07/2016   ID:  Samuel Houston, DOB 11/17/1945, MRN [id number]  PCP:  [name], MD  Cardiologist:  Rhona Leavens Primary Electrophysiologist:  [name], MD    Chief Complaint  Patient presents with  . Pacemaker Check    Symptomatic Bradycardia     History of Present Illness: Samuel Houston is a 70 y.o. male who presents today for electrophysiology evaluation.   Past medical history significant for hypertension, diabetes, and CAD s/p CABG. He had an episode of near syncope and had a 4 second sinus pause while in the ER with syncope. Had a St. Jude dual chamber pacemaker placed at that time.   Today, he denies symptoms of palpitations, chest pain, shortness of breath, orthopnea, PND, lower extremity edema, claudication, dizziness, presyncope, syncope, bleeding, or neurologic sequela. The patient is tolerating medications without difficulties and is otherwise without complaint today.    Past Medical History:  Diagnosis Date  . CAD (coronary artery disease)    s/p CABG  . Diabetes mellitus   . Hypertension   . 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 A-V CARDIAC PACEMAKER INSERTION Left 04/26/2016   [facility] Jude Medical Assurity MRI  model [id number] (serial number  [id number] )  . CARDIAC CATHETERIZATION N/A 04/26/2016   Procedure: Left Heart Cath and Cors/Grafts Angiography;  Surgeon: [name], MD;  Location: [facility] INVASIVE CV LAB;  Service: Cardiovascular;  Laterality: N/A;  . CORONARY ARTERY BYPASS GRAFT  08-07-11  . EP IMPLANTABLE DEVICE N/A 04/26/2016   Procedure: Pacemaker Implant;  Surgeon: [name], MD;  Location: [facility] INVASIVE CV LAB;  Service: Cardiovascular;  Laterality: N/A;     Current Outpatient Prescriptions  Medication Sig Dispense Refill  . clopidogrel (PLAVIX) 75 MG  tablet Take 1 tablet (75 mg total) by mouth daily. 30 tablet 11  . DOCOSAHEXAENOIC ACID PO Take 2 capsules by mouth daily.    . fenofibrate 160 MG tablet Take 160 mg by mouth daily.    . ferrous sulfate 325 (65 FE) MG EC tablet Take 325 mg by mouth daily.    . folic acid (FOLVITE) 1 MG tablet Take 1 mg by mouth daily.    Marland Kitchen glipiZIDE (GLUCOTROL) 10 MG tablet Take 10 mg by mouth daily.    Marland Kitchen ibuprofen (ADVIL,MOTRIN) 200 MG tablet Take 200 mg by mouth every 6 (six) hours as needed for moderate pain.    Marland Kitchen lisinopril (PRINIVIL,ZESTRIL) 2.5 MG tablet Take 2.5 mg by mouth daily.     . metFORMIN (GLUCOPHAGE) 1000 MG tablet Take 1,000 mg by mouth 2 (two) times daily.    . Multiple Vitamins-Minerals (ZINC PO) Take 1 tablet by mouth daily.    Marland Kitchen omeprazole (PRILOSEC) 40 MG capsule Take 40 mg by mouth daily.    . saw palmetto 80 MG capsule Take 80 mg by mouth 2 (two) times daily.    . tamsulosin (FLOMAX) 0.4 MG CAPS capsule Take 0.4 mg by mouth daily.     No current facility-administered medications for this visit.     Allergies:   Ascorbate and Penicillins   Social History:  The patient  reports that he quit smoking about 12 years ago. His smoking use included Cigarettes. He smoked 3.00 packs per day. He has never used smokeless tobacco. He reports that he does  not drink alcohol or use drugs.   Family History:  The patient's family history includes Diabetes in his maternal grandmother and mother; Heart attack in his father and paternal grandfather; Heart disease in his father and paternal grandfather; Hypertension in his mother.    ROS:  Please see the history of present illness.   Otherwise, review of systems is positive for none.   All other systems are reviewed and negative.    PHYSICAL EXAM: VS:  BP (!) 156/90   Pulse 66   Ht 5\' 8"  (1.727 m)   Wt 185 lb 1.9 oz (84 kg)   BMI 28.15 kg/m  , BMI Body mass index is 28.15 kg/m. GEN: Well nourished, well developed, in no acute distress  HEENT:  normal  Neck: no JVD, carotid bruits, or masses Cardiac: RRR; no murmurs, rubs, or gallops,no edema  Respiratory:  clear to auscultation bilaterally, normal work of breathing GI: soft, nontender, nondistended, + BS MS: no deformity or atrophy  Skin: warm and dry,  device pocket is well healed Neuro:  Strength and sensation are intact Psych: euthymic mood, full affect  EKG:  EKG is ordered today. Personal review of the ekg ordered shows sinus rhythm, inferior infarct, rate 66   Device interrogation is reviewed today in detail.  See PaceArt for details.   Recent Labs: 04/25/2016: ALT 57 04/26/2016: Hemoglobin 12.7; Platelets 157 04/27/2016: BUN 13; Creatinine, Ser 1.06; Potassium 4.5; Sodium 134    Lipid Panel     Component Value Date/Time   CHOL 192 04/26/2016 0407   TRIG 483 (H) 04/26/2016 0407   HDL 20 (L) 04/26/2016 0407   CHOLHDL 9.6 04/26/2016 0407   VLDL UNABLE TO CALCULATE IF TRIGLYCERIDE OVER 400 mg/dL 16/10/960408/25/2017 54090407   LDLCALC UNABLE TO CALCULATE IF TRIGLYCERIDE OVER 400 mg/dL 81/19/147808/25/2017 [id number]     Wt Readings from Last 3 Encounters:  08/07/16 185 lb 1.9 oz (84 kg)  04/26/16 176 lb (79.8 kg)  04/25/16 181 lb (82.1 kg)      Other studies Reviewed: Additional studies/ records that were reviewed today include: TTE 04/27/16  Review of the above records today demonstrates:  - Left ventricle: The cavity size was normal. There was severe   concentric hypertrophy. Systolic function was normal. The   estimated ejection fraction was in the range of 60% to 65%. Wall   motion was normal; there were no regional wall motion   abnormalities. Doppler parameters are consistent with abnormal   left ventricular relaxation (grade 1 diastolic dysfunction).   There was no evidence of elevated ventricular filling pressure by   Doppler parameters. - Aortic valve: Trileaflet; mildly thickened, mildly calcified   leaflets. - Aortic root: The aortic root was normal in size. - Mitral  valve: Calcified annulus. Mildly thickened leaflets .   There was mild regurgitation. - Left atrium: The atrium was normal in size. - Right ventricle: Systolic function was normal. - Right atrium: The atrium was normal in size. - Tricuspid valve: There was mild regurgitation. - Pulmonary arteries: Systolic pressure was within the normal   range. - Inferior vena cava: The vessel was normal in size. - Pericardium, extracardiac: There was no pericardial effusion.  Cardiac Cath 04/26/16  Severe native three vessel CAD.  Mid LAD lesion, 100 %stenosed. Patent LIMA to LAD.  100% diagonal occlusion. Patent SVG to diagonal.  Prox Cx to Mid Cx lesion, 100 %stenosed. Patent SVG to OM.  Prox RCA to Mid RCA lesion, 100 %stenosed. Patent SVG to  PDA.  The left ventricular systolic function is normal.  LV end diastolic pressure is normal.  The left ventricular ejection fraction is 55-65% by visual estimate.  There is no aortic valve stenosis.   ASSESSMENT AND PLAN:  1.  Sinus pauses: s/p St. Jude pacemaker. Functioning appropriately.  Will make no changes today.  Will plan on remote checks and follow up in 9 months.  2. CAD: s/p CABG as above.  No current chest pain  3.  Hypertension: elevated today.  Lisinopril has been increased to 10 mg by his PCP. He has not started this yet.  Will check BP at home and call PCP with results if it remains high.    Current medicines are reviewed at length with the patient today.   The patient does not have concerns regarding his medicines.  The following changes were made today:  none  Labs/ tests ordered today include:  No orders of the defined types were placed in this encounter.    Disposition:   FU with Will Camnitz 9 months  Signed, Will Jorja LoaMartin Camnitz, MD  08/07/2016 2:18 PM     [facility] [address] [place] [address] [phone] (office) [phone] (fax)

## 2016-08-07 NOTE — Patient Instructions (Signed)
Medication Instructions:    Your physician recommends that you continue on your current medications as directed. Please refer to the Current Medication list given to you today.  --- If you need a refill on your cardiac medications before your next appointment, please call your pharmacy. ---  Labwork:  None ordered  Testing/Procedures:  None ordered  Follow-Up: Remote monitoring is used to monitor your Pacemaker of ICD from home. This monitoring reduces the number of office visits required to check your device to one time per year. It allows us to keep an eye on the functioning of your device to ensure it is working properly. You are scheduled for a device check from home on 11/06/2016. You may send your transmission at any time that day. If you have a wireless device, the transmission will be sent automatically. After your physician reviews your transmission, you will receive a postcard with your next transmission date.   Your physician wants you to follow-up in: 9 months with Dr. Elberta Fortisamnitz.  You will receive a reminder letter in the mail two months in advance. If you don't receive a letter, please call our office to schedule the follow-up appointment.   Thank you for choosing [facility]!!   Dory HornSherri Valine Drozdowski, RN [phone]

## 2016-11-06 ENCOUNTER — Telehealth: Payer: Self-pay | Admitting: Cardiology

## 2016-11-06 ENCOUNTER — Encounter: Payer: Medicare HMO | Admitting: *Deleted

## 2016-11-06 NOTE — Telephone Encounter (Signed)
Spoke with pt and reminded pt of remote transmission that is due today. Pt verbalized understanding.   

## 2016-11-08 ENCOUNTER — Encounter: Payer: Self-pay | Admitting: Cardiology

## 2017-02-03 ENCOUNTER — Ambulatory Visit (INDEPENDENT_AMBULATORY_CARE_PROVIDER_SITE_OTHER): Payer: Medicare HMO | Admitting: *Deleted

## 2017-02-03 DIAGNOSIS — I455 Other specified heart block: Secondary | ICD-10-CM

## 2017-02-03 NOTE — Progress Notes (Signed)
Remote pacemaker transmission.   

## 2017-02-05 LAB — CUP PACEART REMOTE DEVICE CHECK
Battery Remaining Longevity: 127 mo
Battery Remaining Percentage: 95.5 %
Battery Voltage: 3.01 V
Brady Statistic AP VP Percent: 1 %
Brady Statistic AP VS Percent: 2.9 %
Brady Statistic AS VP Percent: 1 %
Brady Statistic AS VS Percent: 97 %
Brady Statistic RA Percent Paced: 2.8 %
Brady Statistic RV Percent Paced: 1 %
Date Time Interrogation Session: 20180603082913
Implantable Lead Implant Date: 20170825
Implantable Lead Implant Date: 20170825
Implantable Lead Location: 753859
Implantable Lead Location: 753860
Implantable Pulse Generator Implant Date: 20170825
Lead Channel Impedance Value: 460 Ohm
Lead Channel Impedance Value: 540 Ohm
Lead Channel Pacing Threshold Amplitude: 0.875 V
Lead Channel Pacing Threshold Amplitude: 0.875 V
Lead Channel Pacing Threshold Pulse Width: 0.5 ms
Lead Channel Pacing Threshold Pulse Width: 0.5 ms
Lead Channel Sensing Intrinsic Amplitude: 12 mV
Lead Channel Sensing Intrinsic Amplitude: 5 mV
Lead Channel Setting Pacing Amplitude: 1.125
Lead Channel Setting Pacing Amplitude: 1.875
Lead Channel Setting Pacing Pulse Width: 0.5 ms
Lead Channel Setting Sensing Sensitivity: 2 mV
Pulse Gen Model: 2272
Pulse Gen Serial Number: 7941497

## 2017-02-11 ENCOUNTER — Encounter: Payer: Self-pay | Admitting: Cardiology

## 2017-05-08 ENCOUNTER — Telehealth: Payer: Self-pay | Admitting: Cardiology

## 2017-05-08 ENCOUNTER — Ambulatory Visit (INDEPENDENT_AMBULATORY_CARE_PROVIDER_SITE_OTHER): Payer: Medicare HMO | Admitting: *Deleted

## 2017-05-08 DIAGNOSIS — I455 Other specified heart block: Secondary | ICD-10-CM

## 2017-05-08 NOTE — Telephone Encounter (Signed)
Spoke with pt and reminded pt of remote transmission that is due today. Pt verbalized understanding.   

## 2017-05-09 NOTE — Progress Notes (Signed)
Remote pacemaker transmission.   

## 2017-05-13 ENCOUNTER — Encounter: Payer: Self-pay | Admitting: Cardiology

## 2017-06-03 LAB — CUP PACEART REMOTE DEVICE CHECK
Battery Voltage: 3.01 V
Brady Statistic AP VP Percent: 1 %
Brady Statistic AP VS Percent: 3.3 %
Brady Statistic AS VS Percent: 97 %
Brady Statistic RV Percent Paced: 1 %
Implantable Lead Implant Date: 20170825
Implantable Lead Location: 753860
Implantable Pulse Generator Implant Date: 20170825
Lead Channel Impedance Value: 510 Ohm
Lead Channel Pacing Threshold Amplitude: 0.5 V
Lead Channel Pacing Threshold Pulse Width: 0.5 ms
Lead Channel Setting Pacing Amplitude: 0.75 V
Lead Channel Setting Pacing Pulse Width: 0.5 ms
Lead Channel Setting Sensing Sensitivity: 2 mV
MDC IDC LEAD IMPLANT DT: 20170825
MDC IDC LEAD LOCATION: 753859
MDC IDC MSMT BATTERY REMAINING LONGEVITY: 125 mo
MDC IDC MSMT BATTERY REMAINING PERCENTAGE: 95.5 %
MDC IDC MSMT LEADCHNL RA IMPEDANCE VALUE: 480 Ohm
MDC IDC MSMT LEADCHNL RA PACING THRESHOLD AMPLITUDE: 1 V
MDC IDC MSMT LEADCHNL RA PACING THRESHOLD PULSEWIDTH: 0.5 ms
MDC IDC MSMT LEADCHNL RA SENSING INTR AMPL: 5 mV
MDC IDC MSMT LEADCHNL RV SENSING INTR AMPL: 11.8 mV
MDC IDC SESS DTM: 20180906152212
MDC IDC SET LEADCHNL RA PACING AMPLITUDE: 2 V
MDC IDC STAT BRADY AS VP PERCENT: 1 %
MDC IDC STAT BRADY RA PERCENT PACED: 3.2 %
Pulse Gen Serial Number: 7941497

## 2017-08-07 ENCOUNTER — Ambulatory Visit (INDEPENDENT_AMBULATORY_CARE_PROVIDER_SITE_OTHER): Payer: Medicare HMO | Admitting: *Deleted

## 2017-08-07 ENCOUNTER — Telehealth: Payer: Self-pay | Admitting: Cardiology

## 2017-08-07 DIAGNOSIS — I455 Other specified heart block: Secondary | ICD-10-CM

## 2017-08-07 NOTE — Telephone Encounter (Signed)
Spoke with pt and reminded pt of remote transmission that is due today. Pt verbalized understanding.   

## 2017-08-08 NOTE — Progress Notes (Signed)
Remote pacemaker transmission.   

## 2017-08-15 ENCOUNTER — Encounter: Payer: Self-pay | Admitting: Cardiology

## 2017-08-15 LAB — CUP PACEART REMOTE DEVICE CHECK
Battery Remaining Percentage: 95.5 %
Battery Voltage: 3.01 V
Brady Statistic AP VP Percent: 1 %
Brady Statistic AP VS Percent: 3.8 %
Brady Statistic AS VP Percent: 1 %
Date Time Interrogation Session: 20181206215452
Implantable Lead Implant Date: 20170825
Implantable Lead Location: 753859
Implantable Lead Location: 753860
Lead Channel Pacing Threshold Amplitude: 0.75 V
Lead Channel Pacing Threshold Pulse Width: 0.5 ms
Lead Channel Pacing Threshold Pulse Width: 0.5 ms
Lead Channel Sensing Intrinsic Amplitude: 5 mV
Lead Channel Setting Pacing Amplitude: 0.625
Lead Channel Setting Sensing Sensitivity: 2 mV
MDC IDC LEAD IMPLANT DT: 20170825
MDC IDC MSMT BATTERY REMAINING LONGEVITY: 133 mo
MDC IDC MSMT LEADCHNL RA IMPEDANCE VALUE: 460 Ohm
MDC IDC MSMT LEADCHNL RV IMPEDANCE VALUE: 480 Ohm
MDC IDC MSMT LEADCHNL RV PACING THRESHOLD AMPLITUDE: 0.375 V
MDC IDC MSMT LEADCHNL RV SENSING INTR AMPL: 11.2 mV
MDC IDC PG IMPLANT DT: 20170825
MDC IDC PG SERIAL: 7941497
MDC IDC SET LEADCHNL RA PACING AMPLITUDE: 1.75 V
MDC IDC SET LEADCHNL RV PACING PULSEWIDTH: 0.5 ms
MDC IDC STAT BRADY AS VS PERCENT: 96 %
MDC IDC STAT BRADY RA PERCENT PACED: 3.7 %
MDC IDC STAT BRADY RV PERCENT PACED: 1 %

## 2017-08-20 ENCOUNTER — Encounter: Payer: Self-pay | Admitting: Cardiology

## 2017-08-20 ENCOUNTER — Ambulatory Visit (INDEPENDENT_AMBULATORY_CARE_PROVIDER_SITE_OTHER): Payer: Medicare HMO | Admitting: Cardiology

## 2017-08-20 VITALS — BP 166/85 | HR 82 | Ht 68.0 in | Wt 187.4 lb

## 2017-08-20 DIAGNOSIS — Z45018 Encounter for adjustment and management of other part of cardiac pacemaker: Secondary | ICD-10-CM

## 2017-08-20 DIAGNOSIS — I495 Sick sinus syndrome: Secondary | ICD-10-CM

## 2017-08-20 DIAGNOSIS — I25810 Atherosclerosis of coronary artery bypass graft(s) without angina pectoris: Secondary | ICD-10-CM | POA: Diagnosis not present

## 2017-08-20 DIAGNOSIS — I10 Essential (primary) hypertension: Secondary | ICD-10-CM | POA: Diagnosis not present

## 2017-08-20 NOTE — Progress Notes (Signed)
Electrophysiology Office Note   Date:  08/20/2017   ID:  Samuel Houston, DOB 05/20/1946, MRN [id number]  PCP:  [name], MD  Cardiologist:  Rhona Leavens Primary Electrophysiologist:  [name], MD    Chief Complaint  Patient presents with  . Pacemaker Check    Sick sinus syndrome/sinus pause     History of Present Illness: Samuel Houston is a 71 y.o. male who presents today for electrophysiology evaluation.   Past medical history significant for hypertension, diabetes, and CAD s/p CABG. and episode of near syncope and a 4-second sinus pause when in the emergency room with syncope.  He had a Saint Jude dual-chamber pacemaker implanted 04/29/16.     Today, denies symptoms of palpitations, chest pain, shortness of breath, orthopnea, PND, lower extremity edema, claudication, presyncope, syncope, bleeding, or neurologic sequela. The patient is tolerating medications without difficulties.  He is feeling well without major complaint today.  He does have occasional dizziness but occurs mainly when he is sitting down.  He does not feel palpitations weakness or fatigue during that period of dizziness.  He does not feel this is due to heart rhythm abnormality.   Past Medical History:  Diagnosis Date  . CAD (coronary artery disease)    s/p CABG  . Diabetes mellitus   . Hypertension   . 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 A-V CARDIAC PACEMAKER INSERTION Left 04/26/2016   [facility] Jude Medical Assurity MRI  model [id number] (serial number  [id number] )  . CARDIAC CATHETERIZATION N/A 04/26/2016   Procedure: Left Heart Cath and Cors/Grafts Angiography;  Surgeon: [name], MD;  Location: [facility] INVASIVE CV LAB;  Service: Cardiovascular;  Laterality: N/A;  . CORONARY ARTERY BYPASS GRAFT  08-07-11  . EP IMPLANTABLE DEVICE N/A 04/26/2016   Procedure:  Pacemaker Implant;  Surgeon: [name], MD;  Location: [facility] INVASIVE CV LAB;  Service: Cardiovascular;  Laterality: N/A;     Current Outpatient Medications  Medication Sig Dispense Refill  . clopidogrel (PLAVIX) 75 MG tablet Take 1 tablet (75 mg total) by mouth daily. 30 tablet 11  . DOCOSAHEXAENOIC ACID PO Take 2 capsules by mouth daily.    . DOCOSAHEXAENOIC ACID PO Take 1 capsule by mouth 2 (two) times daily.    . fenofibrate 160 MG tablet Take 160 mg by mouth daily.    . ferrous sulfate 325 (65 FE) MG EC tablet Take 325 mg by mouth daily.    . folic acid (FOLVITE) 1 MG tablet Take 1 mg by mouth daily.    Marland Kitchen glipiZIDE (GLUCOTROL) 10 MG tablet Take 10 mg by mouth daily.    Marland Kitchen ibuprofen (ADVIL,MOTRIN) 200 MG tablet Take 200 mg by mouth every 6 (six) hours as needed for moderate pain.    Marland Kitchen lisinopril (PRINIVIL,ZESTRIL) 2.5 MG tablet Take 2.5 mg by mouth daily.     . metFORMIN (GLUCOPHAGE) 1000 MG tablet Take 1,000 mg by mouth 2 (two) times daily.    . metoprolol tartrate (LOPRESSOR) 25 MG tablet Take 25 mg by mouth 2 (two) times daily.    . Multiple Vitamins-Minerals (ZINC PO) Take 1 tablet by mouth daily.    Marland Kitchen omeprazole (PRILOSEC) 40 MG capsule Take 40 mg by mouth daily.    . saw palmetto 80 MG capsule Take 80 mg by mouth 2 (two) times daily.    Marland Kitchen  tamsulosin (FLOMAX) 0.4 MG CAPS capsule Take 0.4 mg by mouth daily.     No current facility-administered medications for this visit.     Allergies:   Ascorbate and Penicillins   Social History:  The patient  reports that he quit smoking about 13 years ago. His smoking use included cigarettes. He smoked 3.00 packs per day. he has never used smokeless tobacco. He reports that he does not drink alcohol or use drugs.   Family History:  The patient's family history includes Diabetes in his maternal grandmother and mother; Heart attack in his father and paternal grandfather; Heart disease in his father and paternal grandfather; Hypertension in  his mother.   ROS:  Please see the history of present illness.   Otherwise, review of systems is positive for dizziness.   All other systems are reviewed and negative.   PHYSICAL EXAM: VS:  BP (!) 166/85   Pulse 82   Ht 5\' 8"  (1.727 m)   Wt 187 lb 6.4 oz (85 kg)   BMI 28.49 kg/m  , BMI Body mass index is 28.49 kg/m. GEN: Well nourished, well developed, in no acute distress  HEENT: normal  Neck: no JVD, carotid bruits, or masses Cardiac: RRR; no murmurs, rubs, or gallops,no edema  Respiratory:  clear to auscultation bilaterally, normal work of breathing GI: soft, nontender, nondistended, + BS MS: no deformity or atrophy  Skin: warm and dry, device site well healed Neuro:  Strength and sensation are intact Psych: euthymic mood, full affect  EKG:  EKG is ordered today. Personal review of the ekg ordered shows anus rhythm, incomplete right bundle branch block, inferior infarct (old), rate 82  Personal review of the device interrogation today. Results in Paceart   Recent Labs: No results found for requested labs within last 8760 hours.    Lipid Panel     Component Value Date/Time   CHOL 192 04/26/2016 0407   TRIG 483 (H) 04/26/2016 0407   HDL 20 (L) 04/26/2016 0407   CHOLHDL 9.6 04/26/2016 0407   VLDL UNABLE TO CALCULATE IF TRIGLYCERIDE OVER 400 mg/dL 16/10/960408/25/2017 54090407   LDLCALC UNABLE TO CALCULATE IF TRIGLYCERIDE OVER 400 mg/dL 81/19/147808/25/2017 29560407     Wt Readings from Last 3 Encounters:  08/20/17 187 lb 6.4 oz (85 kg)  08/07/16 185 lb 1.9 oz (84 kg)  04/26/16 176 lb (79.8 kg)      Other studies Reviewed: Additional studies/ records that were reviewed today include: TTE 04/27/16  Review of the above records today demonstrates:  - Left ventricle: The cavity size was normal. There was severe   concentric hypertrophy. Systolic function was normal. The   estimated ejection fraction was in the range of 60% to 65%. Wall   motion was normal; there were no regional wall motion    abnormalities. Doppler parameters are consistent with abnormal   left ventricular relaxation (grade 1 diastolic dysfunction).   There was no evidence of elevated ventricular filling pressure by   Doppler parameters. - Aortic valve: Trileaflet; mildly thickened, mildly calcified   leaflets. - Aortic root: The aortic root was normal in size. - Mitral valve: Calcified annulus. Mildly thickened leaflets .   There was mild regurgitation. - Left atrium: The atrium was normal in size. - Right ventricle: Systolic function was normal. - Right atrium: The atrium was normal in size. - Tricuspid valve: There was mild regurgitation. - Pulmonary arteries: Systolic pressure was within the normal   range. - Inferior vena cava: The vessel  was normal in size. - Pericardium, extracardiac: There was no pericardial effusion.  Cardiac Cath 04/26/16  Severe native three vessel CAD.  Mid LAD lesion, 100 %stenosed. Patent LIMA to LAD.  100% diagonal occlusion. Patent SVG to diagonal.  Prox Cx to Mid Cx lesion, 100 %stenosed. Patent SVG to OM.  Prox RCA to Mid RCA lesion, 100 %stenosed. Patent SVG to PDA.  The left ventricular systolic function is normal.  LV end diastolic pressure is normal.  The left ventricular ejection fraction is 55-65% by visual estimate.  There is no aortic valve stenosis.   ASSESSMENT AND PLAN:  1.  Sinus pauses: Status post Saint Jude dual-chamber pacemaker implanted 04/26/16.  Device functioning appropriately today.  No changes.  2. CAD: s/p CABG as above.  No current chest pain.  Continue current management.  3.  Hypertension: elevated today.  Currently on 2.5 mg of lisinopril.  Blood pressure is elevated today.  Time, he would prefer to have his blood pressure medications adjusted by his primary physician.  4.  Dizziness: Unclear as to the cause.  Device interrogation shows no rhythm abnormalities.  Continue with current management.  Current medicines are reviewed at  length with the patient today.   The patient does not have concerns regarding his medicines.  The following changes were made today: None  Labs/ tests ordered today include:  Orders Placed This Encounter  Procedures  . EKG 12-Lead     Disposition:   FU with Will Camnitz 12 months  Signed, Will Jorja LoaMartin Camnitz, MD  08/20/2017 3:35 PM     [facility] [address] [place] [address] [phone] (office) [phone] (fax)

## 2017-08-20 NOTE — Patient Instructions (Addendum)
Medication Instructions:  Your physician recommends that you continue on your current medications as directed. Please refer to the Current Medication list given to you today.  * If you need a refill on your cardiac medications before your next appointment, please call your pharmacy. *  Labwork: None ordered  Testing/Procedures: None ordered  Follow-Up: Remote monitoring is used to monitor your Pacemaker of ICD from home. This monitoring reduces the number of office visits required to check your device to one time per year. It allows us to keep an eye on the functioning of your device to ensure it is working properly. You are scheduled for a device check from home on 11/06/2017. You may send your transmission at any time that day. If you have a wireless device, the transmission will be sent automatically. After your physician reviews your transmission, you will receive a postcard with your next transmission date.  Your physician wants you to follow-up in: 1 year with Dr. Elberta Fortisamnitz.  You will receive a reminder letter in the mail two months in advance. If you don't receive a letter, please call our office to schedule the follow-up appointment.  Thank you for choosing [facility]!!   Dory HornSherri Price, RN [phone]  Any Other Special Instructions Will Be Listed Below (If Applicable).

## 2017-09-23 ENCOUNTER — Emergency Department (HOSPITAL_COMMUNITY): Payer: Medicare HMO

## 2017-09-23 ENCOUNTER — Encounter (HOSPITAL_COMMUNITY): Payer: Self-pay | Admitting: Emergency Medicine

## 2017-09-23 ENCOUNTER — Emergency Department (HOSPITAL_COMMUNITY)
Admission: EM | Admit: 2017-09-23 | Discharge: 2017-09-23 | Disposition: A | Discharge status: Home / Self Care | Payer: Medicare HMO | Attending: Emergency Medicine | Admitting: Emergency Medicine

## 2017-09-23 DIAGNOSIS — I668 Occlusion and stenosis of other cerebral arteries: Secondary | ICD-10-CM | POA: Insufficient documentation

## 2017-09-23 DIAGNOSIS — Z7902 Long term (current) use of antithrombotics/antiplatelets: Secondary | ICD-10-CM | POA: Insufficient documentation

## 2017-09-23 DIAGNOSIS — Z951 Presence of aortocoronary bypass graft: Secondary | ICD-10-CM | POA: Insufficient documentation

## 2017-09-23 DIAGNOSIS — Z87891 Personal history of nicotine dependence: Secondary | ICD-10-CM | POA: Diagnosis not present

## 2017-09-23 DIAGNOSIS — R4781 Slurred speech: Secondary | ICD-10-CM | POA: Diagnosis not present

## 2017-09-23 DIAGNOSIS — I10 Essential (primary) hypertension: Secondary | ICD-10-CM | POA: Diagnosis not present

## 2017-09-23 DIAGNOSIS — Z7984 Long term (current) use of oral hypoglycemic drugs: Secondary | ICD-10-CM | POA: Diagnosis not present

## 2017-09-23 DIAGNOSIS — E1159 Type 2 diabetes mellitus with other circulatory complications: Secondary | ICD-10-CM | POA: Diagnosis not present

## 2017-09-23 DIAGNOSIS — R42 Dizziness and giddiness: Secondary | ICD-10-CM | POA: Insufficient documentation

## 2017-09-23 DIAGNOSIS — Z950 Presence of cardiac pacemaker: Secondary | ICD-10-CM | POA: Insufficient documentation

## 2017-09-23 DIAGNOSIS — Z79899 Other long term (current) drug therapy: Secondary | ICD-10-CM | POA: Diagnosis not present

## 2017-09-23 DIAGNOSIS — I2510 Atherosclerotic heart disease of native coronary artery without angina pectoris: Secondary | ICD-10-CM | POA: Diagnosis not present

## 2017-09-23 LAB — CBC WITH DIFFERENTIAL/PLATELET
Basophils Absolute: 0 10*3/uL (ref 0.0–0.1)
Basophils Relative: 1 %
EOS ABS: 0.1 10*3/uL (ref 0.0–0.7)
EOS PCT: 1 %
HCT: 38.3 % — ABNORMAL LOW (ref 39.0–52.0)
Hemoglobin: 12.7 g/dL — ABNORMAL LOW (ref 13.0–17.0)
LYMPHS ABS: 1.3 10*3/uL (ref 0.7–4.0)
Lymphocytes Relative: 22 %
MCH: 30.2 pg (ref 26.0–34.0)
MCHC: 33.2 g/dL (ref 30.0–36.0)
MCV: 91.2 fL (ref 78.0–100.0)
MONO ABS: 0.5 10*3/uL (ref 0.1–1.0)
Monocytes Relative: 8 %
Neutro Abs: 4.1 10*3/uL (ref 1.7–7.7)
Neutrophils Relative %: 68 %
PLATELETS: 158 10*3/uL (ref 150–400)
RBC: 4.2 MIL/uL — AB (ref 4.22–5.81)
RDW: 13.3 % (ref 11.5–15.5)
WBC: 5.9 10*3/uL (ref 4.0–10.5)

## 2017-09-23 LAB — URINALYSIS, ROUTINE W REFLEX MICROSCOPIC
BILIRUBIN URINE: NEGATIVE
GLUCOSE, UA: 150 mg/dL — AB
HGB URINE DIPSTICK: NEGATIVE
KETONES UR: NEGATIVE mg/dL
Leukocytes, UA: NEGATIVE
Nitrite: NEGATIVE
PROTEIN: NEGATIVE mg/dL
Specific Gravity, Urine: 1.012 (ref 1.005–1.030)
pH: 6 (ref 5.0–8.0)

## 2017-09-23 LAB — BASIC METABOLIC PANEL
Anion gap: 12 (ref 5–15)
BUN: 14 mg/dL (ref 6–20)
CHLORIDE: 103 mmol/L (ref 101–111)
CO2: 22 mmol/L (ref 22–32)
Calcium: 9.5 mg/dL (ref 8.9–10.3)
Creatinine, Ser: 1.26 mg/dL — ABNORMAL HIGH (ref 0.61–1.24)
GFR calc Af Amer: >60 mL/min (ref >60)
GFR calc non Af Amer: 55 mL/min — ABNORMAL LOW (ref >60)
GLUCOSE: 142 mg/dL — AB (ref 65–99)
Potassium: 4.4 mmol/L (ref 3.5–5.1)
SODIUM: 137 mmol/L (ref 135–145)

## 2017-09-23 LAB — RAPID URINE DRUG SCREEN, HOSP PERFORMED
Amphetamines: NOT DETECTED
BARBITURATES: NOT DETECTED
BENZODIAZEPINES: NOT DETECTED
COCAINE: NOT DETECTED
Opiates: NOT DETECTED
Tetrahydrocannabinol: NOT DETECTED

## 2017-09-23 MED ORDER — GADOBENATE DIMEGLUMINE 529 MG/ML IV SOLN
18.0000 mL | Freq: Once | INTRAVENOUS | Status: AC
  Administered 2017-09-23: 18 mL via INTRAVENOUS

## 2017-09-23 MED ORDER — ASPIRIN 81 MG PO CHEW
81.0000 mg | CHEWABLE_TABLET | Freq: Once | ORAL | Status: AC
  Administered 2017-09-23: 81 mg via ORAL
  Filled 2017-09-23: qty 1

## 2017-09-23 NOTE — Discharge Instructions (Signed)
Call [facility] or ask your primary care physician for referral to a neurologist to arrange to be seen within the next 2 weeks.  Your symptoms of dizziness or off-balance may recur from time to time.  Return if your condition worsens for any reason.  Your blood pressure should be rechecked within the next 1 week.  Today's was elevated at 168/90.  Take aspirin 81 mg daily for the next 3 months in addition to your usual medications

## 2017-09-23 NOTE — ED Notes (Signed)
Pt giving urine sample at this time.

## 2017-09-23 NOTE — Consult Note (Addendum)
Neurology Consultation Reason for Consult: Dizziness Referring Physician: Alden ServerJacubowitz, S  CC: Speech difficulty  History is obtained from:patient   HPI: Samuel Houston is a 72 y.o. male with a history of previous stroke with left-sided weakness who [facility] with right-sided weakness and difficulty with speech.  Since that time, he has had several episodes when he gets dizzy he has worsening difficulty with his speech.  He has a long history of dizzy episodes which he states of been happening for 7 or 8 years.  They are worse after standing, he lays down and then they improve.  He has had them a little bit worse since his stroke.  Tonight, he felt "disoriented" and had some difficulty with his speech as well. This lasted for a few minutes and he was still having trouble when EMS arrived and thererfore they recommended transport to the hospital.    LKW: 1/11 tpa given?: no, symptoms resolved Premorbid modified rankin scale: 1   ROS: A 14 point ROS was performed and is negative except as noted in the HPI.   Past Medical History:  Diagnosis Date  . CAD (coronary artery disease)    s/p CABG  . Diabetes mellitus   . Hypertension   . 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 Diabetes Mother   . Hypertension Mother   . Heart attack Father   . Heart disease Father   . Diabetes Maternal Grandmother   . Heart attack Paternal Grandfather   . Heart disease Paternal Grandfather      Social History:  reports that he quit smoking about 13 years ago. His smoking use included cigarettes. He smoked 3.00 packs per day. he has never used smokeless tobacco. He reports that he does not drink alcohol or use drugs.   Exam: Current vital signs: BP (!) 162/76   Pulse 80   Temp 98.2 F (36.8 C)   Resp 14   Ht 6' (1.829 m)   Wt 84.8  kg (187 lb)   SpO2 99%   BMI 25.36 kg/m  Vital signs in last 24 hours: Temp:  [88.2 F (31.2 C)-98.2 F (36.8 C)] 98.2 F (36.8 C) (01/22 1714) Pulse Rate:  [80] 80 (01/22 1700) Resp:  [14-17] 14 (01/22 1700) BP: (162-167)/(76-85) 162/76 (01/22 1700) SpO2:  [99 %-100 %] 99 % (01/22 1700) Weight:  [84.8 kg (187 lb)] 84.8 kg (187 lb) (01/22 1624)   Physical Exam  Constitutional: Appears well-developed and well-nourished.  Psych: Affect appropriate to situation Eyes: No scleral injection HENT: No OP obstrucion Head: Normocephalic.  Cardiovascular: Normal rate and regular rhythm.  Respiratory: Effort normal, non-labored breathing GI: Soft.  No distension. There is no tenderness.  Skin: WDI  Neuro: Mental Status: Patient is awake, alert, oriented to person, place, month, year, and situation. Patient is able to give a clear and coherent history. No signs of aphasia or neglect Cranial Nerves: II: Visual Fields are full. Pupils are equal, round, and reactive to light.   III,IV, VI: EOMI without ptosis or diploplia.  V: Facial sensation is symmetric to temperature VII: Facial movement is symmetric.  VIII: hearing is intact to voice X: Uvula elevates symmetrically XI: Shoulder shrug is symmetric. XII: tongue is midline without atrophy or fasciculations.  Motor: Tone is normal. Bulk is normal. 5/5 strength was present in  all four extremities with the exception of 5-/5 triceps opn the left.  Sensory: Sensation is symmetric to light touch and temperature in the arms and legs. Cerebellar: FNF intact bilaterally  I have reviewed labs in epic and the results pertinent to this consultation are: BMP - Cr 1.26  Impression: 72 yo M with recent small infarct with some waxing/waning of his symptoms. He does describe orthostasis which is longstanding, and I suspect  Could be secondary to DM neuropathy +/- cardiac involvement. I suspect this may be the source of his  dizziness/disorientation.   The stereotyped symptoms would argue against a cardioembolic cause. ASA could be considered as an adjunctive option, but I am hesitant to do so in someone with a history(not significantly anemic currently) of recurrent anemia and peptic ulcer unless there was clear significant intracranial stenosis.   Recommendations: 1) MRI brain, MRA head and neck.  2) No further workup at this time unless new finding to suggest recurrent embolic disease.   Ritta Slot, MD [facility] [phone]  If 7pm- 7am, please page neurology on call as listed in AMION.  Reviewed MRI. The patient has no symptoms that would be consistent with a midbrain infarct. I suspect that it is artifactual, but if real, would likely represent small vessel disease. Given limited interventions and significant intracranial stenosis, I do think that dual anti-platelet therapy for 3 months would be reasonable. He is not very anemic at the current time.   I would favor ASA 81mg  daily in addition to plavix followed by plavix monotherapy. If he develops bloody or dark tarry stools, he should seek evaluation for this.   Without current symptoms(e.g. No need for therapy) and with his recent stroke workup, I do not think that inpatient workup would change thing sat this time and therefore do not recommend admission. He should follow up with outpatient neurology.   Ritta Slot, MD [facility] [phone]  If 7pm- 7am, please page neurology on call as listed in AMION.

## 2017-09-23 NOTE — ED Provider Notes (Signed)
[facility] EMERGENCY DEPARTMENT Provider Note   CSN: [id number] Arrival date & time: 09/23/17  1619     History   Chief Complaint Chief Complaint  Patient presents with  . Stroke Symptoms    HPI Samuel Houston is a 72 y.o. male.  HPI She had 2 episodes today where he was unsteady on his feet each lasting a few minutes.  He also had similar episode yesterday.  His son noticed an episode of slurred speech this afternoon which lasted a few minutes.  He was brought by EMS.  Presently asymptomatic and looks normal per wife and son who accompany him Past Medical History:  Diagnosis Date  . CAD (coronary artery disease)    s/p CABG  . Diabetes mellitus   . Hypertension   . 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 Carotid stenosis 04/28/2016  . Near syncope 04/26/2016  . Sinus pause 04/26/2016  . Dizziness 04/25/2016  . Slurred speech   . Faintness   . Iron deficiency anemia 09/12/2015  . 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 Hx of CABG 09/11/2015  . CAD (coronary artery disease) 09/11/2015  . Hyperlipidemia 12/17/2013  . HTN (hypertension), benign 12/17/2013  . 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 A-V CARDIAC PACEMAKER INSERTION Left 04/26/2016   [facility] [employer] MRI  model [id number] (serial number  [id number] )  . CARDIAC CATHETERIZATION N/A 04/26/2016   Procedure: Left Heart Cath and Cors/Grafts Angiography;  Surgeon: [name], MD;  Location: [facility] INVASIVE CV LAB;  Service: Cardiovascular;  Laterality: N/A;  . CORONARY ARTERY BYPASS GRAFT  08-07-11  . EP IMPLANTABLE DEVICE N/A 04/26/2016   Procedure: Pacemaker Implant;  Surgeon: [name], MD;  Location: [facility] INVASIVE CV LAB;  Service: Cardiovascular;  Laterality:  N/A;       Home Medications    Prior to Admission medications   Medication Sig Start Date End Date Taking? Authorizing Provider  clopidogrel (PLAVIX) 75 MG tablet Take 1 tablet (75 mg total) by mouth daily. 04/27/16   Barrett, Joline Salt, PA-C  DOCOSAHEXAENOIC ACID PO Take 2 capsules by mouth daily. 12/06/15   [provider]  DOCOSAHEXAENOIC ACID PO Take 1 capsule by mouth 2 (two) times daily. 12/06/15   [provider]  fenofibrate 160 MG tablet Take 160 mg by mouth daily. 02/29/16   [provider]  ferrous sulfate 325 (65 FE) MG EC tablet Take 325 mg by mouth daily.    [provider]  folic acid (FOLVITE) 1 MG tablet Take 1 mg by mouth daily.    [provider]  glipiZIDE (GLUCOTROL) 10 MG tablet Take 10 mg by mouth daily. 02/29/16   [provider]  ibuprofen (ADVIL,MOTRIN) 200 MG tablet Take 200 mg by mouth every 6 (six) hours as needed for moderate pain.    [provider]  lisinopril (PRINIVIL,ZESTRIL) 2.5 MG tablet Take 2.5 mg by mouth daily.     [provider]  metFORMIN (GLUCOPHAGE) 1000 MG tablet Take 1,000 mg by mouth 2 (two) times daily. 02/29/16   [provider]  metoprolol tartrate (LOPRESSOR) 25 MG tablet Take 25 mg by mouth 2 (two) times daily. 08/31/15   [provider]  Multiple Vitamins-Minerals (ZINC PO)  Take 1 tablet by mouth daily.    [provider]  omeprazole (PRILOSEC) 40 MG capsule Take 40 mg by mouth daily. 09/08/15   [provider]  saw palmetto 80 MG capsule Take 80 mg by mouth 2 (two) times daily.    [provider]  tamsulosin (FLOMAX) 0.4 MG CAPS capsule Take 0.4 mg by mouth daily. 03/13/16   [provider]    Family History Family History  Problem Relation Age of Onset  . Diabetes Mother   . Hypertension Mother   . Heart attack Father   . Heart disease Father   . Diabetes Maternal Grandmother   . Heart attack Paternal Grandfather    . Heart disease Paternal Grandfather     Social History Social History   Tobacco Use  . Smoking status: Former Smoker    Packs/day: 3.00    Types: Cigarettes    Last attempt to quit: 08/02/2004    Years since quitting: 13.1  . Smokeless tobacco: Never Used  Substance Use Topics  . Alcohol use: No  . Drug use: No     Allergies   Ascorbate and Penicillins   Review of Systems Review of Systems  Constitutional: Negative.   HENT: Negative.   Respiratory: Negative.   Cardiovascular: Negative.   Gastrointestinal: Negative.   Musculoskeletal: Positive for gait problem.  Skin: Negative.   Neurological: Positive for speech difficulty.  Psychiatric/Behavioral: Negative.   All other systems reviewed and are negative.    Physical Exam Updated Vital Signs BP (!) 162/76   Pulse 80   Temp 98.2 F (36.8 C)   Resp 14   Ht 6' (1.829 m)   Wt 84.8 kg (187 lb)   SpO2 99%   BMI 25.36 kg/m   Physical Exam  Constitutional: He is oriented to person, place, and time. He appears well-developed and well-nourished. No distress.  HENT:  Head: Normocephalic and atraumatic.  Eyes: Conjunctivae are normal. Pupils are equal, round, and reactive to light.  Neck: Neck supple. No tracheal deviation present. No thyromegaly present.  Cardiovascular: Normal rate and regular rhythm.  No murmur heard. Pulmonary/Chest: Effort normal and breath sounds normal.  Abdominal: Soft. Bowel sounds are normal. He exhibits no distension. There is no tenderness.  Musculoskeletal: Normal range of motion. He exhibits no edema or tenderness.  Neurological: He is alert and oriented to person, place, and time. Coordination normal.  Alert Glasgow Coma Score 15 finger to nose normal DTR symmetric bilaterally knee jerk ankle jerk biceps toes downgoing bilaterally  Skin: Skin is warm and dry. No rash noted.  Psychiatric: He has a normal mood and affect.  Nursing note and vitals reviewed.    ED Treatments /  Results  Labs (all labs ordered are listed, but only abnormal results are displayed) Labs Reviewed - No data to display  EKG  EKG Interpretation None      Results for orders placed or performed during the hospital encounter of 09/23/17  Urine rapid drug screen (hosp performed)not at [facility]  Result Value Ref Range   Opiates NONE DETECTED NONE DETECTED   Cocaine NONE DETECTED NONE DETECTED   Benzodiazepines NONE DETECTED NONE DETECTED   Amphetamines NONE DETECTED NONE DETECTED   Tetrahydrocannabinol NONE DETECTED NONE DETECTED   Barbiturates NONE DETECTED NONE DETECTED  Basic metabolic panel  Result Value Ref Range   Sodium 137 135 - 145 mmol/L   Potassium 4.4 3.5 - 5.1 mmol/L   Chloride 103 101 - 111 mmol/L  CO2 22 22 - 32 mmol/L   Glucose, Bld 142 (H) 65 - 99 mg/dL   BUN 14 6 - 20 mg/dL   Creatinine, Ser 1.611.26 (H) 0.61 - 1.24 mg/dL   Calcium 9.5 8.9 - 09.610.3 mg/dL   GFR calc non Af Amer 55 (L) >60 mL/min   GFR calc Af Amer >60 >60 mL/min   Anion gap 12 5 - 15  CBC with Differential/Platelet  Result Value Ref Range   WBC 5.9 4.0 - 10.5 K/uL   RBC 4.20 (L) 4.22 - 5.81 MIL/uL   Hemoglobin 12.7 (L) 13.0 - 17.0 g/dL   HCT 04.538.3 (L) 40.939.0 - 81.152.0 %   MCV 91.2 78.0 - 100.0 fL   MCH 30.2 26.0 - 34.0 pg   MCHC 33.2 30.0 - 36.0 g/dL   RDW 91.413.3 78.211.5 - 95.615.5 %   Platelets 158 150 - 400 K/uL   Neutrophils Relative % 68 %   Neutro Abs 4.1 1.7 - 7.7 K/uL   Lymphocytes Relative 22 %   Lymphs Abs 1.3 0.7 - 4.0 K/uL   Monocytes Relative 8 %   Monocytes Absolute 0.5 0.1 - 1.0 K/uL   Eosinophils Relative 1 %   Eosinophils Absolute 0.1 0.0 - 0.7 K/uL   Basophils Relative 1 %   Basophils Absolute 0.0 0.0 - 0.1 K/uL  Urinalysis, Routine w reflex microscopic  Result Value Ref Range   Color, Urine YELLOW YELLOW   APPearance CLEAR CLEAR   Specific Gravity, Urine 1.012 1.005 - 1.030   pH 6.0 5.0 - 8.0   Glucose, UA 150 (A) NEGATIVE mg/dL   Hgb urine dipstick NEGATIVE NEGATIVE   Bilirubin  Urine NEGATIVE NEGATIVE   Ketones, ur NEGATIVE NEGATIVE mg/dL   Protein, ur NEGATIVE NEGATIVE mg/dL   Nitrite NEGATIVE NEGATIVE   Leukocytes, UA NEGATIVE NEGATIVE   Mr Maxine GlennMra Head Wo Contrast  Result Date: 09/23/2017 CLINICAL DATA:  RIGHT-sided weakness and speech difficulties. Dizziness. History of stroke and LEFT-sided weakness. EXAM: MRI HEAD WITHOUT CONTRAST MRA HEAD WITHOUT CONTRAST MRA NECK WITHOUT AND WITH CONTRAST TECHNIQUE: Multiplanar, multiecho pulse sequences of the brain and surrounding structures were obtained without intravenous contrast. Angiographic images of the Circle of Willis were obtained using MRA technique without intravenous contrast. Angiographic images of the neck were obtained using MRA technique without and with intravenous contrast. Carotid stenosis measurements (when applicable) are obtained utilizing NASCET criteria, using the distal internal carotid diameter as the denominator. CONTRAST:  18mL MULTIHANCE GADOBENATE DIMEGLUMINE 529 MG/ML IV SOLN COMPARISON:  MRI of the head September 12, 2017 and CT HEAD September 10, 2017. FINDINGS: MRI HEAD FINDINGS INTRACRANIAL CONTENTS: 3 mm focus reduced diffusion LEFT central midbrain with low ADC values. Punctate focus of reduced diffusion LEFT frontal lobe with normalized ADC values corresponding to prior MRI. No susceptibility artifact to suggest hemorrhage. Old RIGHT basal ganglia infarct with ex vacuo dilatation subjacent RIGHT lateral ventricle. Ventricles and sulci are overall normal for patient's age. Patchy supratentorial white matter FLAIR T2 hyperintensities. No midline shift, mass effect or masses. Old small LEFT cerebellar infarct. No abnormal extra-axial fluid collections. VASCULAR: Normal major intracranial vascular flow voids present at skull base. SKULL AND UPPER CERVICAL SPINE: No abnormal sellar expansion. No suspicious calvarial bone marrow signal. Craniocervical junction maintained. SINUSES/ORBITS: Trace paranasal sinus  mucosal thickening, bilateral maxillary mucosal retention cysts. Mastoid air cells are well aerated.The included ocular globes and orbital contents are non-suspicious. OTHER: None. MRA HEAD FINDINGS ANTERIOR CIRCULATION: Normal flow related enhancement of the  included cervical, petrous, cavernous and supraclinoid internal carotid arteries. Patent anterior communicating artery. Patent anterior and middle cerebral arteries, including distal segments. Mild stenosis distal RIGHT M1 segment. Moderate to severe stenosis proximal RIGHT M2 segment, superior division. No large vessel occlusion, flow limiting stenosis, aneurysm. POSTERIOR CIRCULATION: LEFT vertebral artery is dominant. RIGHT vertebral artery terminates in the posterior inferior cerebellar artery. Basilar artery is patent, with normal flow related enhancement of the main branch vessels. Mild stenosis mid basilar artery compatible with atherosclerosis. Patent posterior cerebral arteries. Tiny RIGHT posterior communicating artery present. Moderate to severe stenosis proximal RIGHT P3 segment. No large vessel occlusion, flow limiting stenosis,  aneurysm. ANATOMIC VARIANTS: None. Source images and MIP images were reviewed. MRA NECK FINDINGS ANTERIOR CIRCULATION: The common carotid arteries are widely patent bilaterally. The carotid bifurcations are patent bilaterally. 50% stenosis RIGHT internal carotid artery by NASCET criteria. No suspicious luminal irregularity. POSTERIOR CIRCULATION: Moderate RIGHT and severe stenosis LEFT vertebral artery origins. LEFT vertebral artery is dominant, moderate luminal irregularity RIGHT vertebral artery's. No discrete pseudo aneurysm. Bilateral vertebral arteries are patent to the vertebrobasilar junction. No flow limiting stenosis. Source images and MIP images were reviewed. IMPRESSION: MRI HEAD: 1. 3 mm LEFT central midbrain acute infarct versus artifact. 2. Subacute punctate LEFT frontal lobe infarct. 3. Old RIGHT basal  ganglia infarct. Mild to moderate chronic small vessel ischemic disease and tiny old LEFT cerebellar infarct. MRA HEAD: 1. No emergent large vessel occlusion. 2. Moderate to severe stenosis RIGHT M2 and RIGHT P3 origins. MRA NECK: 1. 50% stenosis RIGHT internal carotid artery origin. 2. Severe stenosis LEFT vertebral artery origin. Moderate stenosis RIGHT vertebral artery origin with moderate luminal irregularity which could reflect old dissection, or atherosclerosis. Electronically Signed   By: Awilda Metro M.D.   On: 09/23/2017 21:39   Mr Maxine Glenn Neck W Wo Contrast  Result Date: 09/23/2017 CLINICAL DATA:  RIGHT-sided weakness and speech difficulties. Dizziness. History of stroke and LEFT-sided weakness. EXAM: MRI HEAD WITHOUT CONTRAST MRA HEAD WITHOUT CONTRAST MRA NECK WITHOUT AND WITH CONTRAST TECHNIQUE: Multiplanar, multiecho pulse sequences of the brain and surrounding structures were obtained without intravenous contrast. Angiographic images of the Circle of Willis were obtained using MRA technique without intravenous contrast. Angiographic images of the neck were obtained using MRA technique without and with intravenous contrast. Carotid stenosis measurements (when applicable) are obtained utilizing NASCET criteria, using the distal internal carotid diameter as the denominator. CONTRAST:  18mL MULTIHANCE GADOBENATE DIMEGLUMINE 529 MG/ML IV SOLN COMPARISON:  MRI of the head September 12, 2017 and CT HEAD September 10, 2017. FINDINGS: MRI HEAD FINDINGS INTRACRANIAL CONTENTS: 3 mm focus reduced diffusion LEFT central midbrain with low ADC values. Punctate focus of reduced diffusion LEFT frontal lobe with normalized ADC values corresponding to prior MRI. No susceptibility artifact to suggest hemorrhage. Old RIGHT basal ganglia infarct with ex vacuo dilatation subjacent RIGHT lateral ventricle. Ventricles and sulci are overall normal for patient's age. Patchy supratentorial white matter FLAIR T2 hyperintensities.  No midline shift, mass effect or masses. Old small LEFT cerebellar infarct. No abnormal extra-axial fluid collections. VASCULAR: Normal major intracranial vascular flow voids present at skull base. SKULL AND UPPER CERVICAL SPINE: No abnormal sellar expansion. No suspicious calvarial bone marrow signal. Craniocervical junction maintained. SINUSES/ORBITS: Trace paranasal sinus mucosal thickening, bilateral maxillary mucosal retention cysts. Mastoid air cells are well aerated.The included ocular globes and orbital contents are non-suspicious. OTHER: None. MRA HEAD FINDINGS ANTERIOR CIRCULATION: Normal flow related enhancement of the included cervical, petrous, cavernous and supraclinoid  internal carotid arteries. Patent anterior communicating artery. Patent anterior and middle cerebral arteries, including distal segments. Mild stenosis distal RIGHT M1 segment. Moderate to severe stenosis proximal RIGHT M2 segment, superior division. No large vessel occlusion, flow limiting stenosis, aneurysm. POSTERIOR CIRCULATION: LEFT vertebral artery is dominant. RIGHT vertebral artery terminates in the posterior inferior cerebellar artery. Basilar artery is patent, with normal flow related enhancement of the main branch vessels. Mild stenosis mid basilar artery compatible with atherosclerosis. Patent posterior cerebral arteries. Tiny RIGHT posterior communicating artery present. Moderate to severe stenosis proximal RIGHT P3 segment. No large vessel occlusion, flow limiting stenosis,  aneurysm. ANATOMIC VARIANTS: None. Source images and MIP images were reviewed. MRA NECK FINDINGS ANTERIOR CIRCULATION: The common carotid arteries are widely patent bilaterally. The carotid bifurcations are patent bilaterally. 50% stenosis RIGHT internal carotid artery by NASCET criteria. No suspicious luminal irregularity. POSTERIOR CIRCULATION: Moderate RIGHT and severe stenosis LEFT vertebral artery origins. LEFT vertebral artery is dominant,  moderate luminal irregularity RIGHT vertebral artery's. No discrete pseudo aneurysm. Bilateral vertebral arteries are patent to the vertebrobasilar junction. No flow limiting stenosis. Source images and MIP images were reviewed. IMPRESSION: MRI HEAD: 1. 3 mm LEFT central midbrain acute infarct versus artifact. 2. Subacute punctate LEFT frontal lobe infarct. 3. Old RIGHT basal ganglia infarct. Mild to moderate chronic small vessel ischemic disease and tiny old LEFT cerebellar infarct. MRA HEAD: 1. No emergent large vessel occlusion. 2. Moderate to severe stenosis RIGHT M2 and RIGHT P3 origins. MRA NECK: 1. 50% stenosis RIGHT internal carotid artery origin. 2. Severe stenosis LEFT vertebral artery origin. Moderate stenosis RIGHT vertebral artery origin with moderate luminal irregularity which could reflect old dissection, or atherosclerosis. Electronically Signed   By: Awilda Metro M.D.   On: 09/23/2017 21:39   Mr Brain Wo Contrast  Result Date: 09/23/2017 CLINICAL DATA:  RIGHT-sided weakness and speech difficulties. Dizziness. History of stroke and LEFT-sided weakness. EXAM: MRI HEAD WITHOUT CONTRAST MRA HEAD WITHOUT CONTRAST MRA NECK WITHOUT AND WITH CONTRAST TECHNIQUE: Multiplanar, multiecho pulse sequences of the brain and surrounding structures were obtained without intravenous contrast. Angiographic images of the Circle of Willis were obtained using MRA technique without intravenous contrast. Angiographic images of the neck were obtained using MRA technique without and with intravenous contrast. Carotid stenosis measurements (when applicable) are obtained utilizing NASCET criteria, using the distal internal carotid diameter as the denominator. CONTRAST:  18mL MULTIHANCE GADOBENATE DIMEGLUMINE 529 MG/ML IV SOLN COMPARISON:  MRI of the head September 12, 2017 and CT HEAD September 10, 2017. FINDINGS: MRI HEAD FINDINGS INTRACRANIAL CONTENTS: 3 mm focus reduced diffusion LEFT central midbrain with low ADC  values. Punctate focus of reduced diffusion LEFT frontal lobe with normalized ADC values corresponding to prior MRI. No susceptibility artifact to suggest hemorrhage. Old RIGHT basal ganglia infarct with ex vacuo dilatation subjacent RIGHT lateral ventricle. Ventricles and sulci are overall normal for patient's age. Patchy supratentorial white matter FLAIR T2 hyperintensities. No midline shift, mass effect or masses. Old small LEFT cerebellar infarct. No abnormal extra-axial fluid collections. VASCULAR: Normal major intracranial vascular flow voids present at skull base. SKULL AND UPPER CERVICAL SPINE: No abnormal sellar expansion. No suspicious calvarial bone marrow signal. Craniocervical junction maintained. SINUSES/ORBITS: Trace paranasal sinus mucosal thickening, bilateral maxillary mucosal retention cysts. Mastoid air cells are well aerated.The included ocular globes and orbital contents are non-suspicious. OTHER: None. MRA HEAD FINDINGS ANTERIOR CIRCULATION: Normal flow related enhancement of the included cervical, petrous, cavernous and supraclinoid internal carotid arteries. Patent anterior communicating artery. Patent  anterior and middle cerebral arteries, including distal segments. Mild stenosis distal RIGHT M1 segment. Moderate to severe stenosis proximal RIGHT M2 segment, superior division. No large vessel occlusion, flow limiting stenosis, aneurysm. POSTERIOR CIRCULATION: LEFT vertebral artery is dominant. RIGHT vertebral artery terminates in the posterior inferior cerebellar artery. Basilar artery is patent, with normal flow related enhancement of the main branch vessels. Mild stenosis mid basilar artery compatible with atherosclerosis. Patent posterior cerebral arteries. Tiny RIGHT posterior communicating artery present. Moderate to severe stenosis proximal RIGHT P3 segment. No large vessel occlusion, flow limiting stenosis,  aneurysm. ANATOMIC VARIANTS: None. Source images and MIP images were  reviewed. MRA NECK FINDINGS ANTERIOR CIRCULATION: The common carotid arteries are widely patent bilaterally. The carotid bifurcations are patent bilaterally. 50% stenosis RIGHT internal carotid artery by NASCET criteria. No suspicious luminal irregularity. POSTERIOR CIRCULATION: Moderate RIGHT and severe stenosis LEFT vertebral artery origins. LEFT vertebral artery is dominant, moderate luminal irregularity RIGHT vertebral artery's. No discrete pseudo aneurysm. Bilateral vertebral arteries are patent to the vertebrobasilar junction. No flow limiting stenosis. Source images and MIP images were reviewed. IMPRESSION: MRI HEAD: 1. 3 mm LEFT central midbrain acute infarct versus artifact. 2. Subacute punctate LEFT frontal lobe infarct. 3. Old RIGHT basal ganglia infarct. Mild to moderate chronic small vessel ischemic disease and tiny old LEFT cerebellar infarct. MRA HEAD: 1. No emergent large vessel occlusion. 2. Moderate to severe stenosis RIGHT M2 and RIGHT P3 origins. MRA NECK: 1. 50% stenosis RIGHT internal carotid artery origin. 2. Severe stenosis LEFT vertebral artery origin. Moderate stenosis RIGHT vertebral artery origin with moderate luminal irregularity which could reflect old dissection, or atherosclerosis. Electronically Signed   By: Awilda Metro M.D.   On: 09/23/2017 21:39   Radiology No results found.  Procedures Procedures (including critical care time)  Medications Ordered in ED Medications - No data to display   Initial Impression / Assessment and Plan / ED Course  I have reviewed the triage vital signs and the nursing notes.  Pertinent labs & imaging results that were available during my care of the patient were reviewed by me and considered in my medical decision making (see chart for details).     Neurology consulted will evaluate patient in ED after MRI scan.  Patient has pacemaker pacemaker technician called and will arrange so that patient can have MRI scan this  evening. Pacemaker was interrogated which showed no significant event Dr. Amada Jupiter evaluated patient in the emergency department does not feel that patient requires hospitalization is not feel that today symptoms reflect MRI findings.  He also suggest that patient's symptoms may recur from time to time.  He suggests outpatient follow-up with neurology and add aspirin 81 mg daily to current medication regimen. I will refer patient to [facility].  He can also call his primary care physician for referral to a neurologist.  He states that he was not referred to neurologist after his recent discharge from [facility].  Suggest blood pressure recheck 1 week. Final Clinical Impressions(s) / ED Diagnoses  Diagnoses #1 dizziness #2 elevated blood pressure Final diagnoses:  None    ED Discharge Orders    None       Doug Sou, MD 09/24/17 [phone]

## 2017-09-23 NOTE — ED Notes (Signed)
Pt HR remained approx 83-89 bpm during MRI. [employer] rep turned pt pacemaker back on after scan. Pt pacemaker now working properly.

## 2017-09-23 NOTE — ED Triage Notes (Signed)
Per EMS: LKW 0800. @ 1400 Pt started to develop dizziness, weakness, and slurred speech. When EMS pt was asymptomatic. Pt had 2 events of confussion and slurred speech with EMS. No neuro deficits. VSS, NSR.

## 2017-11-06 ENCOUNTER — Ambulatory Visit (INDEPENDENT_AMBULATORY_CARE_PROVIDER_SITE_OTHER): Payer: Medicare HMO | Admitting: *Deleted

## 2017-11-06 ENCOUNTER — Telehealth: Payer: Self-pay | Admitting: Cardiology

## 2017-11-06 DIAGNOSIS — I495 Sick sinus syndrome: Secondary | ICD-10-CM | POA: Diagnosis not present

## 2017-11-06 NOTE — Telephone Encounter (Signed)
LMOVM reminding pt to send remote transmission.   

## 2017-11-07 ENCOUNTER — Encounter: Payer: Self-pay | Admitting: Cardiology

## 2017-11-07 NOTE — Progress Notes (Signed)
Remote pacemaker transmission.   

## 2017-11-16 LAB — CUP PACEART REMOTE DEVICE CHECK
Battery Remaining Longevity: 133 mo
Brady Statistic AP VP Percent: 1 %
Brady Statistic AP VS Percent: 3 %
Brady Statistic AS VP Percent: 1 %
Brady Statistic AS VS Percent: 97 %
Brady Statistic RV Percent Paced: 1 %
Date Time Interrogation Session: 20190307210624
Implantable Lead Implant Date: 20170825
Implantable Lead Location: 753860
Lead Channel Impedance Value: 450 Ohm
Lead Channel Pacing Threshold Amplitude: 0.5 V
Lead Channel Pacing Threshold Pulse Width: 0.5 ms
Lead Channel Sensing Intrinsic Amplitude: 5 mV
Lead Channel Sensing Intrinsic Amplitude: 9.5 mV
Lead Channel Setting Pacing Amplitude: 0.75 V
Lead Channel Setting Pacing Amplitude: 1.75 V
Lead Channel Setting Pacing Pulse Width: 0.5 ms
MDC IDC LEAD IMPLANT DT: 20170825
MDC IDC LEAD LOCATION: 753859
MDC IDC MSMT BATTERY REMAINING PERCENTAGE: 95.5 %
MDC IDC MSMT BATTERY VOLTAGE: 3.01 V
MDC IDC MSMT LEADCHNL RA PACING THRESHOLD AMPLITUDE: 0.75 V
MDC IDC MSMT LEADCHNL RA PACING THRESHOLD PULSEWIDTH: 0.5 ms
MDC IDC MSMT LEADCHNL RV IMPEDANCE VALUE: 440 Ohm
MDC IDC PG IMPLANT DT: 20170825
MDC IDC SET LEADCHNL RV SENSING SENSITIVITY: 2 mV
MDC IDC STAT BRADY RA PERCENT PACED: 2.9 %
Pulse Gen Model: 2272
Pulse Gen Serial Number: 7941497

## 2017-12-23 ENCOUNTER — Encounter: Payer: Medicare HMO | Admitting: Cardiology

## 2017-12-31 ENCOUNTER — Encounter: Payer: Self-pay | Admitting: Cardiology

## 2017-12-31 ENCOUNTER — Ambulatory Visit (INDEPENDENT_AMBULATORY_CARE_PROVIDER_SITE_OTHER): Payer: Medicare HMO | Admitting: Cardiology

## 2017-12-31 VITALS — BP 146/81 | HR 80 | Ht 72.0 in | Wt 185.0 lb

## 2017-12-31 DIAGNOSIS — I10 Essential (primary) hypertension: Secondary | ICD-10-CM

## 2017-12-31 DIAGNOSIS — I25810 Atherosclerosis of coronary artery bypass graft(s) without angina pectoris: Secondary | ICD-10-CM

## 2017-12-31 DIAGNOSIS — I639 Cerebral infarction, unspecified: Secondary | ICD-10-CM | POA: Diagnosis not present

## 2017-12-31 DIAGNOSIS — I495 Sick sinus syndrome: Secondary | ICD-10-CM

## 2017-12-31 MED ORDER — LISINOPRIL 10 MG PO TABS: 10.0000 mg | ORAL_TABLET | Freq: Every day | ORAL | 3 refills | Status: DC

## 2017-12-31 NOTE — Addendum Note (Signed)
Addended by: Baird Lyons on: 12/31/2017 04:52 PM   Modules accepted: Orders

## 2017-12-31 NOTE — Patient Instructions (Addendum)
Medication Instructions:  Your physician has recommended you make the following change in your medication:  1. INCREASE Lisinopril to 10 mg once daily  *If you need a refill on your cardiac medications before your next appointment, please call your pharmacy*  Labwork: None ordered  Testing/Procedures: None ordered  Follow-Up: Remote monitoring is used to monitor your Pacemaker or ICD from home. This monitoring reduces the number of office visits required to check your device to one time per year. It allows [place] to keep an eye on the functioning of your device to ensure it is working properly. You are scheduled for a device check from home on 02/05/2018. You may send your transmission at any time that day. If you have a wireless device, the transmission will be sent automatically. After your physician reviews your transmission, you will receive a postcard with your next transmission date.  Your physician wants you to follow-up in: 1 year with [name].  You will receive a reminder letter in the mail two months in advance. If you don't receive a letter, please call our office to schedule the follow-up appointment.  Thank you for choosing [facility]!!   Dory Horn, RN [phone]

## 2017-12-31 NOTE — Progress Notes (Signed)
Electrophysiology Office Note   Date:  12/31/2017   ID:  Samuel Houston, DOB 10-Aug-1946, MRN [id number]  PCP:  [name], MD  Cardiologist:  Rhona Leavens Primary Electrophysiologist:  [name], MD    Chief Complaint  Patient presents with  . Pacemaker Check    Sinus node dysfunction/Sinus pause     History of Present Illness: Samuel Houston is a 72 y.o. male who presents today for electrophysiology evaluation.   Past medical history significant for hypertension, diabetes, and CAD s/p CABG. and episode of near syncope and a 4-second sinus pause when in the emergency room with syncope.  He had a [employer] dual-chamber pacemaker implanted 04/29/16.  He is currently feeling well.  Unfortunately in February, 2019, he had a stroke.  He is improving.  His symptoms were left-sided weakness and blurry vision.   Today, denies symptoms of palpitations, chest pain, shortness of breath, orthopnea, PND, lower extremity edema, claudication, dizziness, presyncope, syncope, bleeding, or neurologic sequela. The patient is tolerating medications without difficulties.     Past Medical History:  Diagnosis Date  . CAD (coronary artery disease)    s/p CABG  . Diabetes mellitus   . Hypertension   . 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 A-V CARDIAC PACEMAKER INSERTION Left 04/26/2016   [facility] Jude Medical Assurity MRI  model [id number] (serial number  [id number] )  . CARDIAC CATHETERIZATION N/A 04/26/2016   Procedure: Left Heart Cath and Cors/Grafts Angiography;  Surgeon: [name], MD;  Location: [facility] INVASIVE CV LAB;  Service: Cardiovascular;  Laterality: N/A;  . CORONARY ARTERY BYPASS GRAFT  08-07-11  . EP IMPLANTABLE DEVICE N/A 04/26/2016   Procedure: Pacemaker Implant;  Surgeon: [name], MD;  Location: [facility] INVASIVE CV LAB;  Service:  Cardiovascular;  Laterality: N/A;     Current Outpatient Medications  Medication Sig Dispense Refill  . aspirin EC 81 MG tablet Take 81 mg by mouth daily.    . clopidogrel (PLAVIX) 75 MG tablet Take 1 tablet (75 mg total) by mouth daily. 30 tablet 11  . fenofibrate 160 MG tablet Take 160 mg by mouth daily.    . ferrous sulfate 325 (65 FE) MG EC tablet Take 325 mg by mouth 2 (two) times daily with a meal.     . finasteride (PROSCAR) 5 MG tablet Take 5 mg by mouth daily.    . folic acid (FOLVITE) 1 MG tablet Take 1 mg by mouth daily.    Marland Kitchen glipiZIDE (GLUCOTROL) 5 MG tablet Take 5 mg by mouth 2 (two) times daily before a meal.    . lisinopril (PRINIVIL,ZESTRIL) 5 MG tablet Take 5 mg by mouth 2 (two) times daily.    . metFORMIN (GLUCOPHAGE) 1000 MG tablet Take 1,000 mg by mouth 2 (two) times daily.    . metoprolol tartrate (LOPRESSOR) 25 MG tablet Take 25 mg by mouth 2 (two) times daily.    . Multiple Vitamins-Minerals (ZINC PO) Take 1 tablet by mouth daily.    . Omega-3 Fatty Acids (FISH OIL PO) Take 1 capsule by mouth 2 (two) times daily.    Marland Kitchen omeprazole (PRILOSEC) 40 MG capsule Take 40 mg by mouth daily.    . rosuvastatin (CRESTOR) 20 MG tablet Take 10 mg by mouth daily.    . tamsulosin (FLOMAX) 0.4 MG CAPS capsule Take 0.4  mg by mouth daily.    . vitamin B-12 (CYANOCOBALAMIN) 1000 MCG tablet Take 1,000 mcg by mouth daily.     No current facility-administered medications for this visit.     Allergies:   Ascorbate and Penicillins   Social History:  The patient  reports that he quit smoking about 13 years ago. His smoking use included cigarettes. He smoked 3.00 packs per day. He has never used smokeless tobacco. He reports that he does not drink alcohol or use drugs.   Family History:  The patient's family history includes Diabetes in his maternal grandmother and mother; Heart attack in his father and paternal grandfather; Heart disease in his father and paternal grandfather; Hypertension  in his mother.   ROS:  Please see the history of present illness.   Otherwise, review of systems is positive for weakness, blurred vision.   All other systems are reviewed and negative.   PHYSICAL EXAM: VS:  BP (!) 146/81   Pulse 80   Ht 6' (1.829 m)   Wt 185 lb (83.9 kg)   BMI 25.09 kg/m  , BMI Body mass index is 25.09 kg/m. GEN: Well nourished, well developed, in no acute distress  HEENT: normal  Neck: no JVD, carotid bruits, or masses Cardiac: RRR; no murmurs, rubs, or gallops,no edema  Respiratory:  clear to auscultation bilaterally, normal work of breathing GI: soft, nontender, nondistended, + BS MS: no deformity or atrophy  Skin: warm and dry, device site well healed Neuro:  Strength and sensation are intact Psych: euthymic mood, full affect  EKG:  EKG is not ordered today. Personal review of the ekg ordered 08/20/18 shows SR, iRBBB, IMI (old)  Personal review of the device interrogation today. Results in [facility]   Recent Labs: 09/23/2017: BUN 14; Creatinine, Ser 1.26; Hemoglobin 12.7; Platelets 158; Potassium 4.4; Sodium 137    Lipid Panel     Component Value Date/Time   CHOL 192 04/26/2016 0407   TRIG 483 (H) 04/26/2016 0407   HDL 20 (L) 04/26/2016 0407   CHOLHDL 9.6 04/26/2016 0407   VLDL UNABLE TO CALCULATE IF TRIGLYCERIDE OVER 400 mg/dL 16/06/9603 5409   LDLCALC UNABLE TO CALCULATE IF TRIGLYCERIDE OVER 400 mg/dL 81/19/1478 2956     Wt Readings from Last 3 Encounters:  12/31/17 185 lb (83.9 kg)  09/23/17 187 lb (84.8 kg)  08/20/17 187 lb 6.4 oz (85 kg)      Other studies Reviewed: Additional studies/ records that were reviewed today include: TTE 04/27/16  Review of the above records today demonstrates:  - Left ventricle: The cavity size was normal. There was severe   concentric hypertrophy. Systolic function was normal. The   estimated ejection fraction was in the range of 60% to 65%. Wall   motion was normal; there were no regional wall motion    abnormalities. Doppler parameters are consistent with abnormal   left ventricular relaxation (grade 1 diastolic dysfunction).   There was no evidence of elevated ventricular filling pressure by   Doppler parameters. - Aortic valve: Trileaflet; mildly thickened, mildly calcified   leaflets. - Aortic root: The aortic root was normal in size. - Mitral valve: Calcified annulus. Mildly thickened leaflets .   There was mild regurgitation. - Left atrium: The atrium was normal in size. - Right ventricle: Systolic function was normal. - Right atrium: The atrium was normal in size. - Tricuspid valve: There was mild regurgitation. - Pulmonary arteries: Systolic pressure was within the normal   range. - Inferior vena  cava: The vessel was normal in size. - Pericardium, extracardiac: There was no pericardial effusion.  Cardiac Cath 04/26/16  Severe native three vessel CAD.  Mid LAD lesion, 100 %stenosed. Patent LIMA to LAD.  100% diagonal occlusion. Patent SVG to diagonal.  Prox Cx to Mid Cx lesion, 100 %stenosed. Patent SVG to OM.  Prox RCA to Mid RCA lesion, 100 %stenosed. Patent SVG to PDA.  The left ventricular systolic function is normal.  LV end diastolic pressure is normal.  The left ventricular ejection fraction is 55-65% by visual estimate.  There is no aortic valve stenosis.   ASSESSMENT AND PLAN:  1.  Sinus pauses: That is post [facility] dual-chamber pacemaker implanted 04/26/2016.  Device functioning appropriately today.  No changes.    2. CAD: s/p CABG as above.  No current chest pain.  Continue current medication  3.  Hypertension: Mildly elevated today.  We will plan to increase his lisinopril to 10 mg twice a day.  4.  Cryptogenic stroke: At this point, it is unclear as to the cause of his cryptogenic stroke.  Device interrogation shows no evidence of atrial fibrillation or atrial flutter.  He has had some atrial tachycardia that have been short-lived, less than 10  seconds.  It is very unlikely that this would be the cause of his CVA.  Unfortunately, device interrogation today is unable to access his rhythm prior to February 26.  He had his stroke earlier in February.  We will try and obtain records from his rhythm prior to this, but these records are in [facility].  Current medicines are reviewed at length with the patient today.   The patient does not have concerns regarding his medicines.  The following changes were made today: increase lisinopril  Labs/ tests ordered today include:  No orders of the defined types were placed in this encounter.  Case discussed with neurologist  Disposition:   FU with Will Camnitz 12 months  Signed, [name], MD  12/31/2017 3:33 PM     [facility] [address] [place] [address] [phone] (office) [phone] (fax)

## 2018-02-05 ENCOUNTER — Ambulatory Visit (INDEPENDENT_AMBULATORY_CARE_PROVIDER_SITE_OTHER): Payer: Medicare HMO | Admitting: *Deleted

## 2018-02-05 DIAGNOSIS — I495 Sick sinus syndrome: Secondary | ICD-10-CM | POA: Diagnosis not present

## 2018-02-05 DIAGNOSIS — I10 Essential (primary) hypertension: Secondary | ICD-10-CM

## 2018-02-06 NOTE — Progress Notes (Signed)
Remote pacemaker transmission.   

## 2018-03-31 LAB — CUP PACEART REMOTE DEVICE CHECK
Battery Remaining Longevity: 133 mo
Battery Remaining Percentage: 95.5 %
Brady Statistic AP VS Percent: 3.8 %
Brady Statistic AS VP Percent: 1 %
Brady Statistic AS VS Percent: 96 %
Date Time Interrogation Session: 20190606093015
Implantable Lead Implant Date: 20170825
Implantable Lead Implant Date: 20170825
Implantable Lead Location: 753860
Implantable Pulse Generator Implant Date: 20170825
Lead Channel Impedance Value: 440 Ohm
Lead Channel Pacing Threshold Amplitude: 1 V
Lead Channel Pacing Threshold Pulse Width: 0.5 ms
Lead Channel Sensing Intrinsic Amplitude: 5 mV
Lead Channel Sensing Intrinsic Amplitude: 9.1 mV
Lead Channel Setting Pacing Amplitude: 1.875
Lead Channel Setting Pacing Pulse Width: 0.5 ms
MDC IDC LEAD LOCATION: 753859
MDC IDC MSMT BATTERY VOLTAGE: 3.01 V
MDC IDC MSMT LEADCHNL RA PACING THRESHOLD AMPLITUDE: 0.875 V
MDC IDC MSMT LEADCHNL RA PACING THRESHOLD PULSEWIDTH: 0.5 ms
MDC IDC MSMT LEADCHNL RV IMPEDANCE VALUE: 440 Ohm
MDC IDC SET LEADCHNL RV PACING AMPLITUDE: 1.25 V
MDC IDC SET LEADCHNL RV SENSING SENSITIVITY: 2 mV
MDC IDC STAT BRADY AP VP PERCENT: 1 %
MDC IDC STAT BRADY RA PERCENT PACED: 3.6 %
MDC IDC STAT BRADY RV PERCENT PACED: 1 %
Pulse Gen Model: 2272
Pulse Gen Serial Number: 7941497

## 2018-05-07 ENCOUNTER — Ambulatory Visit (INDEPENDENT_AMBULATORY_CARE_PROVIDER_SITE_OTHER): Payer: Medicare HMO | Admitting: *Deleted

## 2018-05-07 DIAGNOSIS — I495 Sick sinus syndrome: Secondary | ICD-10-CM | POA: Diagnosis not present

## 2018-05-07 DIAGNOSIS — I10 Essential (primary) hypertension: Secondary | ICD-10-CM

## 2018-05-07 NOTE — Progress Notes (Signed)
Remote pacemaker transmission.   

## 2018-05-08 IMAGING — MR MR MRA NECK WO/W CM
11 of 19 series · 22 of 48 positions shown · IV contrast (multihance)
Comparison: MRI of the head September 12, 2017 and CT HEAD September 10, 2017.

CLINICAL DATA: RIGHT-sided weakness and speech difficulties.
Dizziness. History of stroke and LEFT-sided weakness.

EXAM:
MRI HEAD WITHOUT CONTRAST
MRA HEAD WITHOUT CONTRAST
MRA NECK WITHOUT AND WITH CONTRAST
TECHNIQUE: Multiplanar, multiecho pulse sequences of the brain and surrounding
structures were obtained without intravenous contrast. Angiographic
images of the Circle of Willis were obtained using MRA technique
without intravenous contrast. Angiographic images of the neck were
obtained using MRA technique without and with intravenous contrast.
Carotid stenosis measurements (when applicable) are obtained
utilizing NASCET criteria, using the distal internal carotid
diameter as the denominator.
CONTRAST:  18mL MULTIHANCE GADOBENATE DIMEGLUMINE 529 MG/ML IV SOLN

[Series 3: DWI · axial · 3.0mm · 1.09mm/px · z∈[-77,+51]mm · 3 of 88 slices shown (1 of 4)]
[im 1/88]
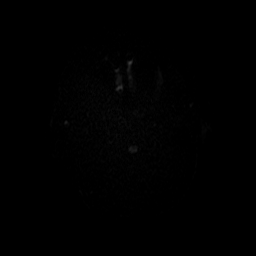
[im 44/88]
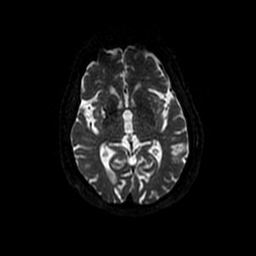
[im 88/88]
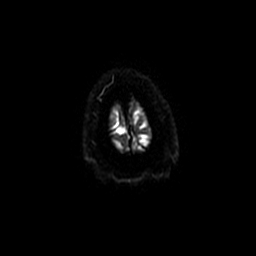

[Series 4: (id) mt fs · axial · 1.4mm · 0.43mm/px · z∈[-93,-0]mm · 5 of 136 slices shown]
[im 1/136]
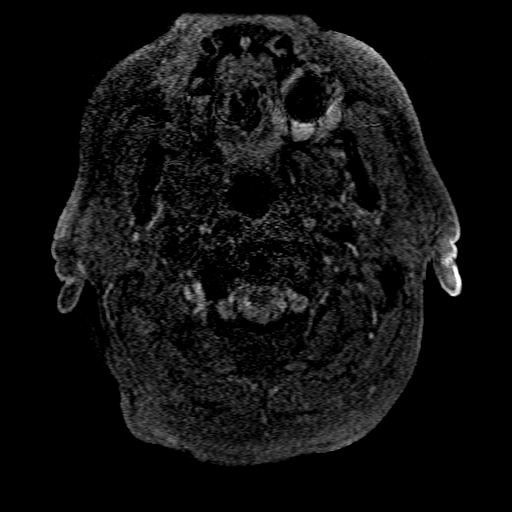
[im 34/136]
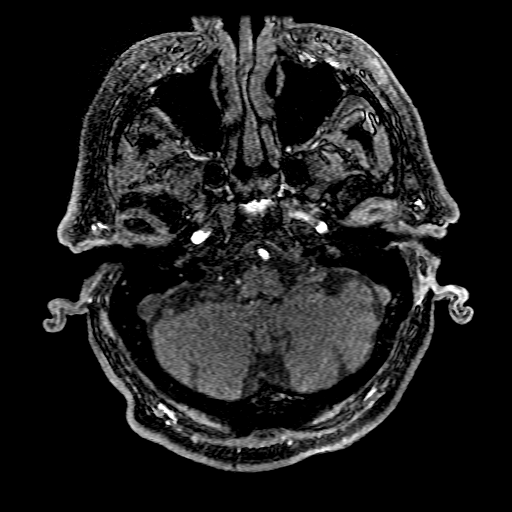
[im 68/136]
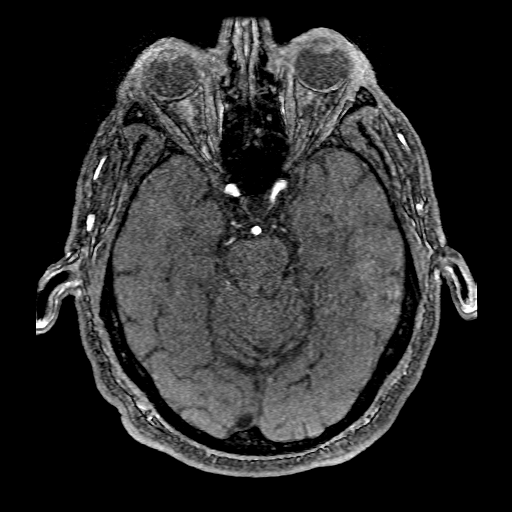
[im 102/136]
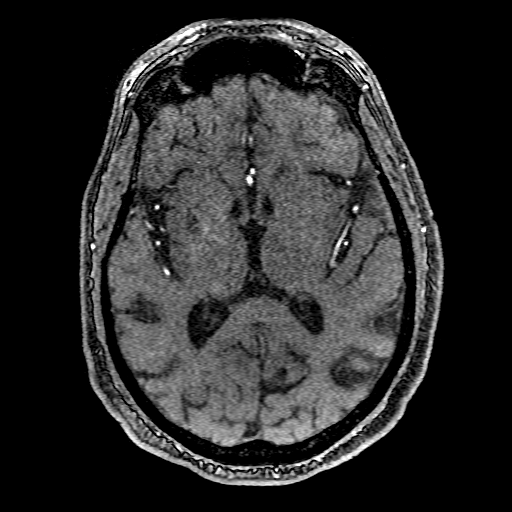
[im 136/136]
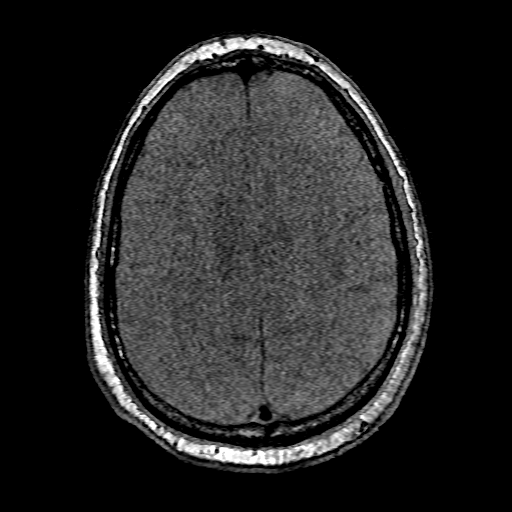

[Series 5: DWI · coronal · 5.0mm · 1.09mm/px · 3 of 68 slices shown (2 of 4)]
[im 1/68]
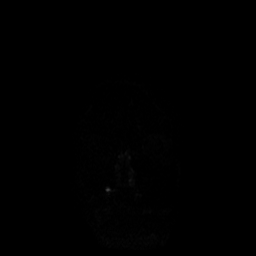
[im 34/68]
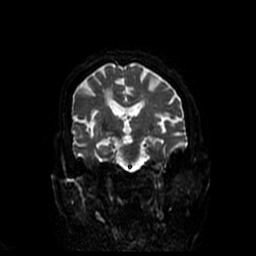
[im 68/68]
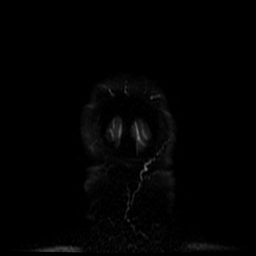

[Series 6: FLAIR · axial · 3.0mm · 0.47mm/px · 1 of 22 slices shown]
[im 1/22]
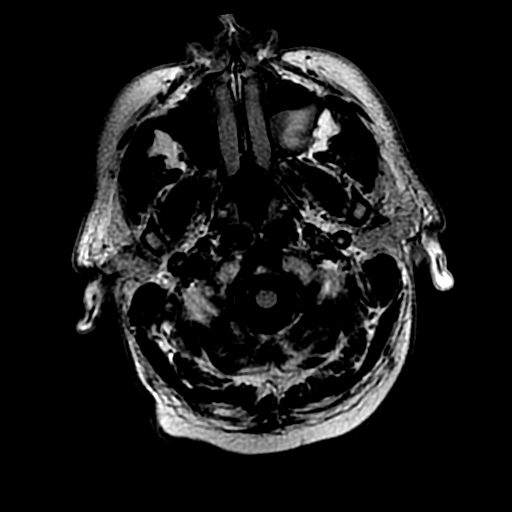

[Series 7: ax mpgr · axial · 5.0mm · 0.47mm/px · 1 of 22 slices shown]
[im 1/22]
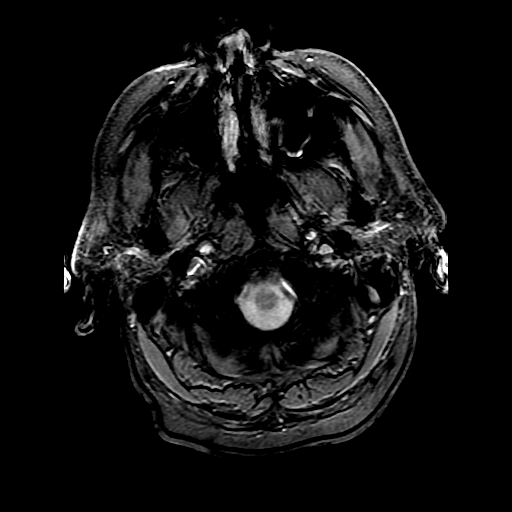

[Series 8: T2 · axial · 5.0mm · 0.47mm/px · 1 of 22 slices shown (1 of 2)]
[im 1/22]
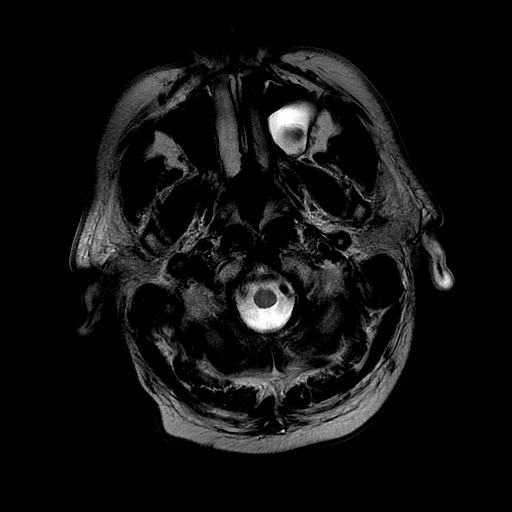

[Series 10: T1 · sagittal · 5.0mm · 0.47mm/px · 1 of 23 slices shown]
[im 1/23]
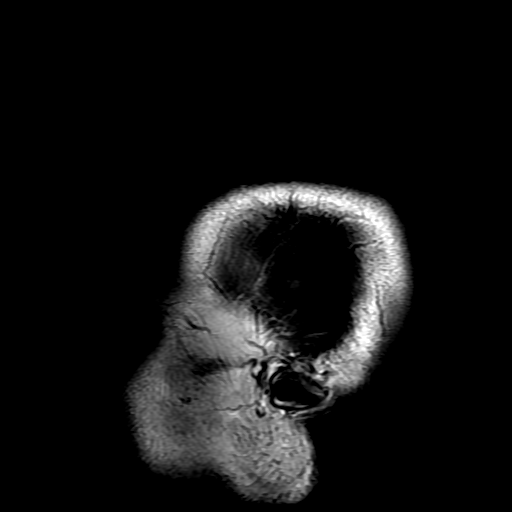

[Series 12: T2 · coronal · 5.0mm · 0.43mm/px · 1 of 29 slices shown (2 of 2)]
[im 1/29]
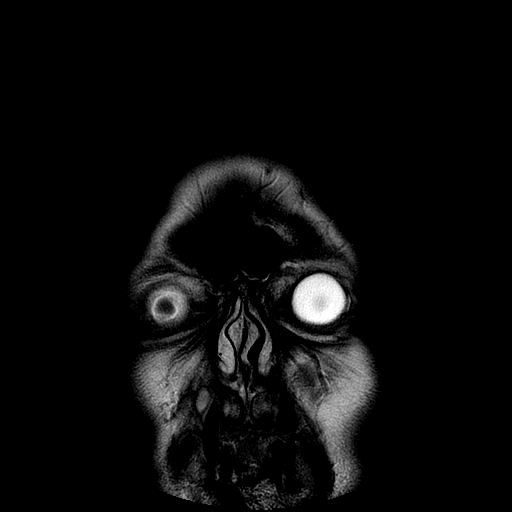

[Series 14: ax (id) · axial · 2.8mm · 0.47mm/px · z∈[-181,-94]mm · 3 of 64 slices shown]
[im 1/64]
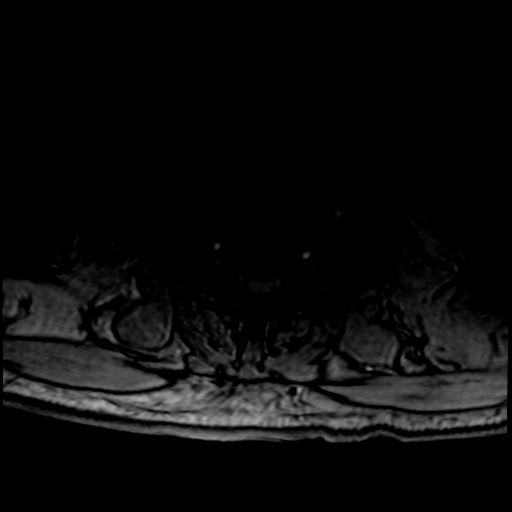
[im 32/64]
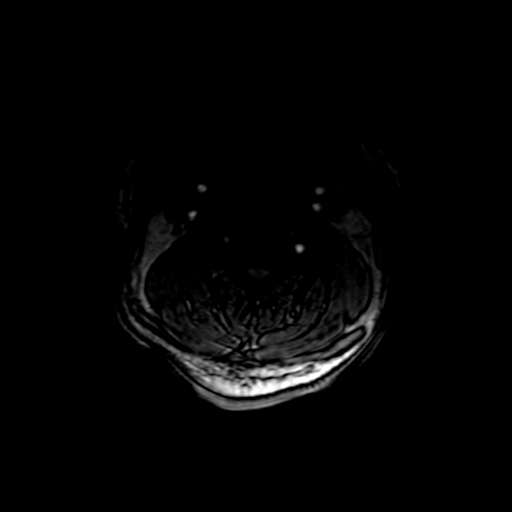
[im 64/64]
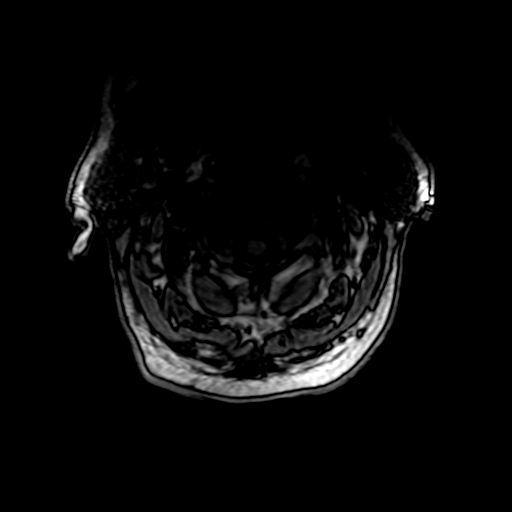

[Series 300: DWI · axial · 3.0mm · 1.09mm/px · z∈[-77,+51]mm · 2 of 44 slices shown (3 of 4)]
[im 1/44]
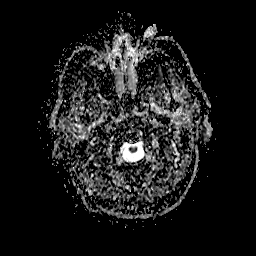
[im 44/44]
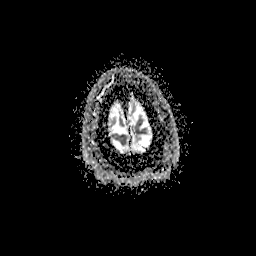

[Series 500: DWI · coronal · 5.0mm · 1.09mm/px · 1 of 34 slices shown (4 of 4)]
[im 1/34]
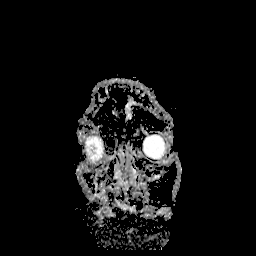

[22 of 48 positions shown; findings below may reference images not displayed]

FINDINGS: MRI HEAD FINDINGS

INTRACRANIAL CONTENTS: 3 mm focus reduced diffusion LEFT central
midbrain with low ADC values. Punctate focus of reduced diffusion
LEFT frontal lobe with normalized ADC values corresponding to prior
MRI. No susceptibility artifact to suggest hemorrhage. Old RIGHT
basal ganglia infarct with ex vacuo dilatation subjacent RIGHT
lateral ventricle. Ventricles and sulci are overall normal for
patient's age. Patchy supratentorial white matter FLAIR T2
hyperintensities. No midline shift, mass effect or masses. Old small
LEFT cerebellar infarct. No abnormal extra-axial fluid collections.

VASCULAR: Normal major intracranial vascular flow voids present at
skull base.

SKULL AND UPPER CERVICAL SPINE: No abnormal sellar expansion. No
suspicious calvarial bone marrow signal. Craniocervical junction
maintained.

SINUSES/ORBITS: Trace paranasal sinus mucosal thickening, bilateral
maxillary mucosal retention cysts. Mastoid air cells are well
aerated..The included ocular globes and orbital contents are
non-suspicious.

OTHER: None.

MRA HEAD FINDINGS

ANTERIOR CIRCULATION: Normal flow related enhancement of the
included cervical, petrous, cavernous and supraclinoid internal
carotid arteries. Patent anterior communicating artery. Patent
anterior and middle cerebral arteries, including distal segments.
Mild stenosis distal RIGHT M1 segment. Moderate to severe stenosis
proximal RIGHT M2 segment, superior division.

No large vessel occlusion, flow limiting stenosis, aneurysm.

POSTERIOR CIRCULATION: LEFT vertebral artery is dominant. RIGHT
vertebral artery terminates in the posterior inferior cerebellar
artery. Basilar artery is patent, with normal flow related
enhancement of the main branch vessels. Mild stenosis mid basilar
artery compatible with atherosclerosis. Patent posterior cerebral
arteries. Tiny RIGHT posterior communicating artery present.
Moderate to severe stenosis proximal RIGHT P3 segment.

No large vessel occlusion, flow limiting stenosis,  aneurysm.

ANATOMIC VARIANTS: None.

Source images and MIP images were reviewed.

MRA NECK FINDINGS

ANTERIOR CIRCULATION: The common carotid arteries are widely patent
bilaterally. The carotid bifurcations are patent bilaterally. 50%
stenosis RIGHT internal carotid artery by NASCET criteria. No
suspicious luminal irregularity.

POSTERIOR CIRCULATION: Moderate RIGHT and severe stenosis LEFT
vertebral artery origins. LEFT vertebral artery is dominant,
moderate luminal irregularity RIGHT vertebral artery's. No discrete
pseudo aneurysm. Bilateral vertebral arteries are patent to the
vertebrobasilar junction. No flow limiting stenosis.

Source images and MIP images were reviewed.
IMPRESSION: MRI HEAD:

1. 3 mm LEFT central midbrain acute infarct versus artifact.
2. Subacute punctate LEFT frontal lobe infarct.
3. Old RIGHT basal ganglia infarct. Mild to moderate chronic small
vessel ischemic disease and tiny old LEFT cerebellar infarct.

MRA HEAD:

1. No emergent large vessel occlusion.
2. Moderate to severe stenosis RIGHT M2 and RIGHT P3 origins.

MRA NECK:

1. 50% stenosis RIGHT internal carotid artery origin.
2. Severe stenosis LEFT vertebral artery origin. Moderate stenosis
RIGHT vertebral artery origin with moderate luminal irregularity
which could reflect old dissection, or atherosclerosis.

## 2018-06-02 LAB — CUP PACEART REMOTE DEVICE CHECK
Battery Remaining Longevity: 132 mo
Brady Statistic AP VP Percent: 1 %
Brady Statistic AS VP Percent: 1 %
Brady Statistic RA Percent Paced: 3.5 %
Brady Statistic RV Percent Paced: 1 %
Date Time Interrogation Session: 20190905060015
Implantable Lead Implant Date: 20170825
Implantable Lead Location: 753859
Implantable Lead Location: 753860
Lead Channel Impedance Value: 400 Ohm
Lead Channel Impedance Value: 440 Ohm
Lead Channel Pacing Threshold Pulse Width: 0.5 ms
Lead Channel Sensing Intrinsic Amplitude: 8.4 mV
Lead Channel Setting Pacing Amplitude: 1.875
Lead Channel Setting Sensing Sensitivity: 2 mV
MDC IDC LEAD IMPLANT DT: 20170825
MDC IDC MSMT BATTERY REMAINING PERCENTAGE: 95.5 %
MDC IDC MSMT BATTERY VOLTAGE: 3.01 V
MDC IDC MSMT LEADCHNL RA PACING THRESHOLD AMPLITUDE: 0.875 V
MDC IDC MSMT LEADCHNL RA SENSING INTR AMPL: 5 mV
MDC IDC MSMT LEADCHNL RV PACING THRESHOLD AMPLITUDE: 1.125 V
MDC IDC MSMT LEADCHNL RV PACING THRESHOLD PULSEWIDTH: 0.5 ms
MDC IDC PG IMPLANT DT: 20170825
MDC IDC SET LEADCHNL RV PACING AMPLITUDE: 1.375
MDC IDC SET LEADCHNL RV PACING PULSEWIDTH: 0.5 ms
MDC IDC STAT BRADY AP VS PERCENT: 3.9 %
MDC IDC STAT BRADY AS VS PERCENT: 96 %
Pulse Gen Model: 2272
Pulse Gen Serial Number: 7941497

## 2018-06-22 ENCOUNTER — Telehealth: Payer: Self-pay | Admitting: Cardiology

## 2018-06-22 NOTE — Telephone Encounter (Signed)
Spoke to [place] about patient's remote transmissions. I informed her that patient has not had any documented AF. Darl Pikes verbalized understanding and asked me to fax over patient's most recent remote transmission.  Remote faxed to [phone], Ivery Quale.

## 2018-06-22 NOTE — Telephone Encounter (Signed)
Darl Pikes with [facility] wants interrogations from [facility]. They want to know if pt has had any afib that would increase his risk for a stroke. Call routed to [employer].

## 2018-08-06 ENCOUNTER — Telehealth: Payer: Self-pay

## 2018-08-06 NOTE — Telephone Encounter (Signed)
LMOVM reminding pt to send remote transmission.   

## 2018-08-11 ENCOUNTER — Encounter: Payer: Self-pay | Admitting: Cardiology

## 2018-08-27 ENCOUNTER — Ambulatory Visit (INDEPENDENT_AMBULATORY_CARE_PROVIDER_SITE_OTHER): Payer: Medicare HMO

## 2018-08-27 DIAGNOSIS — I639 Cerebral infarction, unspecified: Secondary | ICD-10-CM

## 2018-08-27 DIAGNOSIS — I495 Sick sinus syndrome: Secondary | ICD-10-CM

## 2018-08-27 NOTE — Progress Notes (Signed)
Remote pacemaker transmission.   

## 2018-08-30 LAB — CUP PACEART REMOTE DEVICE CHECK
Battery Remaining Percentage: 95.5 %
Battery Voltage: 3.01 V
Brady Statistic AS VS Percent: 97 %
Brady Statistic RA Percent Paced: 2.8 %
Implantable Lead Implant Date: 20170825
Implantable Lead Implant Date: 20170825
Implantable Lead Location: 753859
Implantable Pulse Generator Implant Date: 20170825
Lead Channel Impedance Value: 400 Ohm
Lead Channel Pacing Threshold Amplitude: 0.875 V
Lead Channel Pacing Threshold Pulse Width: 0.5 ms
Lead Channel Pacing Threshold Pulse Width: 0.5 ms
Lead Channel Sensing Intrinsic Amplitude: 4.5 mV
Lead Channel Setting Pacing Amplitude: 1 V
Lead Channel Setting Sensing Sensitivity: 2 mV
MDC IDC LEAD LOCATION: 753860
MDC IDC MSMT BATTERY REMAINING LONGEVITY: 131 mo
MDC IDC MSMT LEADCHNL RA IMPEDANCE VALUE: 440 Ohm
MDC IDC MSMT LEADCHNL RV PACING THRESHOLD AMPLITUDE: 0.75 V
MDC IDC MSMT LEADCHNL RV SENSING INTR AMPL: 10.3 mV
MDC IDC PG SERIAL: 7941497
MDC IDC SESS DTM: 20191224230120
MDC IDC SET LEADCHNL RA PACING AMPLITUDE: 1.875
MDC IDC SET LEADCHNL RV PACING PULSEWIDTH: 0.5 ms
MDC IDC STAT BRADY AP VP PERCENT: 1 %
MDC IDC STAT BRADY AP VS PERCENT: 3.1 %
MDC IDC STAT BRADY AS VP PERCENT: 1 %
MDC IDC STAT BRADY RV PERCENT PACED: 1 %

## 2018-09-01 ENCOUNTER — Encounter: Payer: Self-pay | Admitting: Cardiology

## 2018-11-26 ENCOUNTER — Other Ambulatory Visit: Payer: Self-pay

## 2018-11-26 ENCOUNTER — Ambulatory Visit (INDEPENDENT_AMBULATORY_CARE_PROVIDER_SITE_OTHER): Payer: Medicare HMO | Admitting: *Deleted

## 2018-11-26 DIAGNOSIS — I495 Sick sinus syndrome: Secondary | ICD-10-CM

## 2018-11-27 ENCOUNTER — Telehealth: Payer: Self-pay

## 2018-11-27 NOTE — Telephone Encounter (Signed)
Spoke with patient to remind of missed remote transmission 

## 2018-11-28 LAB — CUP PACEART REMOTE DEVICE CHECK
Battery Remaining Longevity: 133 mo
Brady Statistic AP VP Percent: 1 %
Brady Statistic AS VP Percent: 1 %
Brady Statistic RA Percent Paced: 2.4 %
Brady Statistic RV Percent Paced: 1 %
Date Time Interrogation Session: 20200327190632
Implantable Lead Implant Date: 20170825
Implantable Lead Implant Date: 20170825
Implantable Lead Location: 753859
Implantable Pulse Generator Implant Date: 20170825
Lead Channel Impedance Value: 410 Ohm
Lead Channel Impedance Value: 450 Ohm
Lead Channel Pacing Threshold Amplitude: 0.875 V
Lead Channel Pacing Threshold Pulse Width: 0.5 ms
Lead Channel Sensing Intrinsic Amplitude: 10.5 mV
Lead Channel Setting Pacing Amplitude: 0.875
Lead Channel Setting Pacing Amplitude: 1.875
Lead Channel Setting Pacing Pulse Width: 0.5 ms
MDC IDC LEAD LOCATION: 753860
MDC IDC MSMT BATTERY REMAINING PERCENTAGE: 95.5 %
MDC IDC MSMT BATTERY VOLTAGE: 3.01 V
MDC IDC MSMT LEADCHNL RA SENSING INTR AMPL: 5 mV
MDC IDC MSMT LEADCHNL RV PACING THRESHOLD AMPLITUDE: 0.625 V
MDC IDC MSMT LEADCHNL RV PACING THRESHOLD PULSEWIDTH: 0.5 ms
MDC IDC SET LEADCHNL RV SENSING SENSITIVITY: 2 mV
MDC IDC STAT BRADY AP VS PERCENT: 2.6 %
MDC IDC STAT BRADY AS VS PERCENT: 97 %
Pulse Gen Model: 2272
Pulse Gen Serial Number: 7941497

## 2018-12-03 ENCOUNTER — Encounter: Payer: Self-pay | Admitting: Cardiology

## 2018-12-03 NOTE — Progress Notes (Signed)
Remote pacemaker transmission.   

## 2019-02-25 ENCOUNTER — Ambulatory Visit (INDEPENDENT_AMBULATORY_CARE_PROVIDER_SITE_OTHER): Payer: Medicare HMO | Admitting: *Deleted

## 2019-02-25 DIAGNOSIS — I495 Sick sinus syndrome: Secondary | ICD-10-CM | POA: Diagnosis not present

## 2019-02-26 ENCOUNTER — Telehealth: Payer: Self-pay

## 2019-02-26 NOTE — Telephone Encounter (Signed)
Left message for patient to remind of missed remote transmission.  

## 2019-02-28 LAB — CUP PACEART REMOTE DEVICE CHECK
Battery Remaining Longevity: 133 mo
Battery Remaining Percentage: 95.5 %
Battery Voltage: 2.99 V
Brady Statistic AP VP Percent: 1 %
Brady Statistic AP VS Percent: 3.8 %
Brady Statistic AS VP Percent: 1 %
Brady Statistic AS VS Percent: 96 %
Brady Statistic RA Percent Paced: 3.5 %
Brady Statistic RV Percent Paced: 1 %
Date Time Interrogation Session: 20200628042851
Implantable Lead Implant Date: 20170825
Implantable Lead Implant Date: 20170825
Implantable Lead Location: 753859
Implantable Lead Location: 753860
Implantable Pulse Generator Implant Date: 20170825
Lead Channel Impedance Value: 400 Ohm
Lead Channel Impedance Value: 440 Ohm
Lead Channel Pacing Threshold Amplitude: 0.625 V
Lead Channel Pacing Threshold Amplitude: 0.875 V
Lead Channel Pacing Threshold Pulse Width: 0.5 ms
Lead Channel Pacing Threshold Pulse Width: 0.5 ms
Lead Channel Sensing Intrinsic Amplitude: 4.3 mV
Lead Channel Sensing Intrinsic Amplitude: 8.3 mV
Lead Channel Setting Pacing Amplitude: 0.875
Lead Channel Setting Pacing Amplitude: 1.875
Lead Channel Setting Pacing Pulse Width: 0.5 ms
Lead Channel Setting Sensing Sensitivity: 2 mV
Pulse Gen Model: 2272
Pulse Gen Serial Number: 7941497

## 2019-03-08 ENCOUNTER — Encounter: Payer: Self-pay | Admitting: Cardiology

## 2019-03-08 NOTE — Progress Notes (Signed)
Remote pacemaker transmission.   

## 2019-05-27 ENCOUNTER — Ambulatory Visit (INDEPENDENT_AMBULATORY_CARE_PROVIDER_SITE_OTHER): Payer: Medicare HMO | Admitting: *Deleted

## 2019-05-27 DIAGNOSIS — I495 Sick sinus syndrome: Secondary | ICD-10-CM

## 2019-05-28 LAB — CUP PACEART REMOTE DEVICE CHECK
Battery Remaining Longevity: 133 mo
Battery Remaining Percentage: 95.5 %
Battery Voltage: 3.01 V
Brady Statistic AP VP Percent: 1 %
Brady Statistic AP VS Percent: 5 %
Brady Statistic AS VP Percent: 1 %
Brady Statistic AS VS Percent: 95 %
Brady Statistic RA Percent Paced: 4.5 %
Brady Statistic RV Percent Paced: 1 %
Date Time Interrogation Session: 20200925030851
Implantable Lead Implant Date: 20170825
Implantable Lead Implant Date: 20170825
Implantable Lead Location: 753859
Implantable Lead Location: 753860
Implantable Pulse Generator Implant Date: 20170825
Lead Channel Impedance Value: 410 Ohm
Lead Channel Impedance Value: 440 Ohm
Lead Channel Pacing Threshold Amplitude: 0.625 V
Lead Channel Pacing Threshold Amplitude: 0.875 V
Lead Channel Pacing Threshold Pulse Width: 0.5 ms
Lead Channel Pacing Threshold Pulse Width: 0.5 ms
Lead Channel Sensing Intrinsic Amplitude: 4.1 mV
Lead Channel Sensing Intrinsic Amplitude: 9.4 mV
Lead Channel Setting Pacing Amplitude: 0.875
Lead Channel Setting Pacing Amplitude: 1.875
Lead Channel Setting Pacing Pulse Width: 0.5 ms
Lead Channel Setting Sensing Sensitivity: 2 mV
Pulse Gen Model: 2272
Pulse Gen Serial Number: 7941497

## 2019-06-04 ENCOUNTER — Encounter: Payer: Self-pay | Admitting: Cardiology

## 2019-06-04 NOTE — Progress Notes (Signed)
Remote pacemaker transmission.   

## 2019-08-26 ENCOUNTER — Ambulatory Visit (INDEPENDENT_AMBULATORY_CARE_PROVIDER_SITE_OTHER): Payer: Medicare HMO | Admitting: *Deleted

## 2019-08-26 DIAGNOSIS — I455 Other specified heart block: Secondary | ICD-10-CM

## 2019-08-28 LAB — CUP PACEART REMOTE DEVICE CHECK
Battery Remaining Longevity: 132 mo
Battery Remaining Percentage: 95.5 %
Battery Voltage: 2.99 V
Brady Statistic AP VP Percent: 1 %
Brady Statistic AP VS Percent: 6 %
Brady Statistic AS VP Percent: 1 %
Brady Statistic AS VS Percent: 93 %
Brady Statistic RA Percent Paced: 5.1 %
Brady Statistic RV Percent Paced: 1 %
Date Time Interrogation Session: 20201225225245
Implantable Lead Implant Date: 20170825
Implantable Lead Implant Date: 20170825
Implantable Lead Location: 753859
Implantable Lead Location: 753860
Implantable Pulse Generator Implant Date: 20170825
Lead Channel Impedance Value: 440 Ohm
Lead Channel Impedance Value: 450 Ohm
Lead Channel Pacing Threshold Amplitude: 0.625 V
Lead Channel Pacing Threshold Amplitude: 0.875 V
Lead Channel Pacing Threshold Pulse Width: 0.5 ms
Lead Channel Pacing Threshold Pulse Width: 0.5 ms
Lead Channel Sensing Intrinsic Amplitude: 12 mV
Lead Channel Sensing Intrinsic Amplitude: 5 mV
Lead Channel Setting Pacing Amplitude: 0.875
Lead Channel Setting Pacing Amplitude: 1.875
Lead Channel Setting Pacing Pulse Width: 0.5 ms
Lead Channel Setting Sensing Sensitivity: 2 mV
Pulse Gen Model: 2272
Pulse Gen Serial Number: 7941497

## 2019-11-25 ENCOUNTER — Ambulatory Visit (INDEPENDENT_AMBULATORY_CARE_PROVIDER_SITE_OTHER): Payer: Medicare HMO | Admitting: *Deleted

## 2019-11-25 DIAGNOSIS — I455 Other specified heart block: Secondary | ICD-10-CM

## 2019-11-25 LAB — CUP PACEART REMOTE DEVICE CHECK
Battery Remaining Longevity: 133 mo
Battery Remaining Percentage: 95.5 %
Battery Voltage: 3.01 V
Brady Statistic AP VP Percent: 1 %
Brady Statistic AP VS Percent: 5.8 %
Brady Statistic AS VP Percent: 1 %
Brady Statistic AS VS Percent: 93 %
Brady Statistic RA Percent Paced: 4.9 %
Brady Statistic RV Percent Paced: 1 %
Date Time Interrogation Session: 20210325020006
Implantable Lead Implant Date: 20170825
Implantable Lead Implant Date: 20170825
Implantable Lead Location: 753859
Implantable Lead Location: 753860
Implantable Pulse Generator Implant Date: 20170825
Lead Channel Impedance Value: 450 Ohm
Lead Channel Impedance Value: 490 Ohm
Lead Channel Pacing Threshold Amplitude: 0.5 V
Lead Channel Pacing Threshold Amplitude: 0.875 V
Lead Channel Pacing Threshold Pulse Width: 0.5 ms
Lead Channel Pacing Threshold Pulse Width: 0.5 ms
Lead Channel Sensing Intrinsic Amplitude: 10.6 mV
Lead Channel Sensing Intrinsic Amplitude: 5 mV
Lead Channel Setting Pacing Amplitude: 0.75 V
Lead Channel Setting Pacing Amplitude: 1.875
Lead Channel Setting Pacing Pulse Width: 0.5 ms
Lead Channel Setting Sensing Sensitivity: 2 mV
Pulse Gen Model: 2272
Pulse Gen Serial Number: 7941497

## 2019-11-26 NOTE — Progress Notes (Signed)
PPM Remote  

## 2020-01-08 ENCOUNTER — Emergency Department (HOSPITAL_COMMUNITY): Payer: Medicare HMO

## 2020-01-08 ENCOUNTER — Observation Stay (HOSPITAL_COMMUNITY)
Admission: EM | Admit: 2020-01-08 | Discharge: 2020-01-10 | Disposition: A | Discharge status: Home / Self Care | Payer: Medicare HMO | Attending: Emergency Medicine | Admitting: Emergency Medicine

## 2020-01-08 ENCOUNTER — Other Ambulatory Visit: Payer: Self-pay

## 2020-01-08 DIAGNOSIS — R55 Syncope and collapse: Secondary | ICD-10-CM | POA: Diagnosis not present

## 2020-01-08 DIAGNOSIS — I509 Heart failure, unspecified: Secondary | ICD-10-CM | POA: Insufficient documentation

## 2020-01-08 DIAGNOSIS — Z951 Presence of aortocoronary bypass graft: Secondary | ICD-10-CM | POA: Insufficient documentation

## 2020-01-08 DIAGNOSIS — R29898 Other symptoms and signs involving the musculoskeletal system: Secondary | ICD-10-CM

## 2020-01-08 DIAGNOSIS — I2510 Atherosclerotic heart disease of native coronary artery without angina pectoris: Secondary | ICD-10-CM | POA: Insufficient documentation

## 2020-01-08 DIAGNOSIS — Z7984 Long term (current) use of oral hypoglycemic drugs: Secondary | ICD-10-CM | POA: Insufficient documentation

## 2020-01-08 DIAGNOSIS — Z79899 Other long term (current) drug therapy: Secondary | ICD-10-CM | POA: Insufficient documentation

## 2020-01-08 DIAGNOSIS — N179 Acute kidney failure, unspecified: Secondary | ICD-10-CM | POA: Diagnosis not present

## 2020-01-08 DIAGNOSIS — Z20822 Contact with and (suspected) exposure to covid-19: Secondary | ICD-10-CM | POA: Diagnosis not present

## 2020-01-08 DIAGNOSIS — E119 Type 2 diabetes mellitus without complications: Secondary | ICD-10-CM | POA: Diagnosis not present

## 2020-01-08 DIAGNOSIS — Z950 Presence of cardiac pacemaker: Secondary | ICD-10-CM | POA: Insufficient documentation

## 2020-01-08 DIAGNOSIS — D696 Thrombocytopenia, unspecified: Secondary | ICD-10-CM | POA: Diagnosis not present

## 2020-01-08 DIAGNOSIS — I959 Hypotension, unspecified: Secondary | ICD-10-CM | POA: Diagnosis not present

## 2020-01-08 DIAGNOSIS — Z7902 Long term (current) use of antithrombotics/antiplatelets: Secondary | ICD-10-CM | POA: Insufficient documentation

## 2020-01-08 DIAGNOSIS — I110 Hypertensive heart disease with heart failure: Secondary | ICD-10-CM | POA: Insufficient documentation

## 2020-01-08 DIAGNOSIS — D539 Nutritional anemia, unspecified: Secondary | ICD-10-CM | POA: Insufficient documentation

## 2020-01-08 DIAGNOSIS — K2270 Barrett's esophagus without dysplasia: Secondary | ICD-10-CM | POA: Insufficient documentation

## 2020-01-08 DIAGNOSIS — R197 Diarrhea, unspecified: Secondary | ICD-10-CM | POA: Diagnosis not present

## 2020-01-08 DIAGNOSIS — R4781 Slurred speech: Secondary | ICD-10-CM

## 2020-01-08 LAB — COMPREHENSIVE METABOLIC PANEL
ALT: 68 U/L — ABNORMAL HIGH (ref 0–44)
AST: 64 U/L — ABNORMAL HIGH (ref 15–41)
Albumin: 3.7 g/dL (ref 3.5–5.0)
Alkaline Phosphatase: 18 U/L — ABNORMAL LOW (ref 38–126)
Anion gap: 14 (ref 5–15)
BUN: 21 mg/dL (ref 8–23)
CO2: 18 mmol/L — ABNORMAL LOW (ref 22–32)
Calcium: 9.1 mg/dL (ref 8.9–10.3)
Chloride: 105 mmol/L (ref 98–111)
Creatinine, Ser: 1.73 mg/dL — ABNORMAL HIGH (ref 0.61–1.24)
GFR calc Af Amer: 44 mL/min — ABNORMAL LOW (ref >60)
GFR calc non Af Amer: 38 mL/min — ABNORMAL LOW (ref >60)
Glucose, Bld: 180 mg/dL — ABNORMAL HIGH (ref 70–99)
Potassium: 4.8 mmol/L (ref 3.5–5.1)
Sodium: 137 mmol/L (ref 135–145)
Total Bilirubin: 0.8 mg/dL (ref 0.3–1.2)
Total Protein: 6.4 g/dL — ABNORMAL LOW (ref 6.5–8.1)

## 2020-01-08 LAB — DIFFERENTIAL
Abs Immature Granulocytes: 0.02 10*3/uL (ref 0.00–0.07)
Basophils Absolute: 0.1 10*3/uL (ref 0.0–0.1)
Basophils Relative: 1 %
Eosinophils Absolute: 0.1 10*3/uL (ref 0.0–0.5)
Eosinophils Relative: 2 %
Immature Granulocytes: 0 %
Lymphocytes Relative: 26 %
Lymphs Abs: 1.6 10*3/uL (ref 0.7–4.0)
Monocytes Absolute: 0.7 10*3/uL (ref 0.1–1.0)
Monocytes Relative: 12 %
Neutro Abs: 3.7 10*3/uL (ref 1.7–7.7)
Neutrophils Relative %: 59 %

## 2020-01-08 LAB — GLUCOSE, CAPILLARY: Glucose-Capillary: 223 mg/dL — ABNORMAL HIGH (ref 70–99)

## 2020-01-08 LAB — I-STAT CHEM 8, ED
BUN: 24 mg/dL — ABNORMAL HIGH (ref 8–23)
Calcium, Ion: 1.1 mmol/L — ABNORMAL LOW (ref 1.15–1.40)
Chloride: 107 mmol/L (ref 98–111)
Creatinine, Ser: 1.6 mg/dL — ABNORMAL HIGH (ref 0.61–1.24)
Glucose, Bld: 174 mg/dL — ABNORMAL HIGH (ref 70–99)
HCT: 31 % — ABNORMAL LOW (ref 39.0–52.0)
Hemoglobin: 10.5 g/dL — ABNORMAL LOW (ref 13.0–17.0)
Potassium: 4.8 mmol/L (ref 3.5–5.1)
Sodium: 137 mmol/L (ref 135–145)
TCO2: 20 mmol/L — ABNORMAL LOW (ref 22–32)

## 2020-01-08 LAB — TROPONIN I (HIGH SENSITIVITY)
Troponin I (High Sensitivity): 11 ng/L (ref <18)
Troponin I (High Sensitivity): 13 ng/L (ref <18)

## 2020-01-08 LAB — CBC
HCT: 34.7 % — ABNORMAL LOW (ref 39.0–52.0)
Hemoglobin: 10.4 g/dL — ABNORMAL LOW (ref 13.0–17.0)
MCH: 27.7 pg (ref 26.0–34.0)
MCHC: 30 g/dL (ref 30.0–36.0)
MCV: 92.3 fL (ref 80.0–100.0)
Platelets: 163 10*3/uL (ref 150–400)
RBC: 3.76 MIL/uL — ABNORMAL LOW (ref 4.22–5.81)
RDW: 14.7 % (ref 11.5–15.5)
WBC: 6.3 10*3/uL (ref 4.0–10.5)
nRBC: 0 % (ref 0.0–0.2)

## 2020-01-08 LAB — PROTIME-INR
INR: 1.1 (ref 0.8–1.2)
Prothrombin Time: 13.6 seconds (ref 11.4–15.2)

## 2020-01-08 LAB — RESPIRATORY PANEL BY RT PCR (FLU A&B, COVID)
Influenza A by PCR: NEGATIVE
Influenza B by PCR: NEGATIVE
SARS Coronavirus 2 by RT PCR: NEGATIVE

## 2020-01-08 LAB — APTT: aPTT: 23 seconds — ABNORMAL LOW (ref 24–36)

## 2020-01-08 MED ORDER — INSULIN ASPART 100 UNIT/ML ~~LOC~~ SOLN
5.0000 [IU] | Freq: Once | SUBCUTANEOUS | Status: AC
  Administered 2020-01-08: 5 [IU] via SUBCUTANEOUS

## 2020-01-08 MED ORDER — ASPIRIN EC 81 MG PO TBEC
81.0000 mg | DELAYED_RELEASE_TABLET | Freq: Every day | ORAL | Status: DC
  Administered 2020-01-09 – 2020-01-10 (×2): 81 mg via ORAL
  Filled 2020-01-08 (×2): qty 1

## 2020-01-08 MED ORDER — ENOXAPARIN SODIUM 40 MG/0.4ML ~~LOC~~ SOLN
40.0000 mg | SUBCUTANEOUS | Status: DC
  Administered 2020-01-08 – 2020-01-09 (×2): 40 mg via SUBCUTANEOUS
  Filled 2020-01-08 (×2): qty 0.4

## 2020-01-08 MED ORDER — TAMSULOSIN HCL 0.4 MG PO CAPS
0.4000 mg | ORAL_CAPSULE | Freq: Every day | ORAL | Status: DC
  Administered 2020-01-09 – 2020-01-10 (×2): 0.4 mg via ORAL
  Filled 2020-01-08 (×2): qty 1

## 2020-01-08 MED ORDER — VITAMIN B-12 1000 MCG PO TABS
1000.0000 ug | ORAL_TABLET | Freq: Every day | ORAL | Status: DC
  Administered 2020-01-09 – 2020-01-10 (×2): 1000 ug via ORAL
  Filled 2020-01-08 (×2): qty 1

## 2020-01-08 MED ORDER — FERROUS SULFATE 325 (65 FE) MG PO TABS
325.0000 mg | ORAL_TABLET | Freq: Two times a day (BID) | ORAL | Status: DC
  Administered 2020-01-09 – 2020-01-10 (×3): 325 mg via ORAL
  Filled 2020-01-08 (×3): qty 1

## 2020-01-08 MED ORDER — FOLIC ACID 1 MG PO TABS
1.0000 mg | ORAL_TABLET | Freq: Every day | ORAL | Status: DC
  Administered 2020-01-09 – 2020-01-10 (×2): 1 mg via ORAL
  Filled 2020-01-08 (×2): qty 1

## 2020-01-08 MED ORDER — ROSUVASTATIN CALCIUM 5 MG PO TABS
10.0000 mg | ORAL_TABLET | Freq: Every day | ORAL | Status: DC
  Administered 2020-01-09 – 2020-01-10 (×2): 10 mg via ORAL
  Filled 2020-01-08 (×2): qty 2

## 2020-01-08 MED ORDER — METOPROLOL TARTRATE 25 MG PO TABS
25.0000 mg | ORAL_TABLET | Freq: Two times a day (BID) | ORAL | Status: DC
  Administered 2020-01-08 – 2020-01-10 (×4): 25 mg via ORAL
  Filled 2020-01-08 (×4): qty 1

## 2020-01-08 MED ORDER — LACTATED RINGERS IV SOLN: INTRAVENOUS | Status: AC

## 2020-01-08 MED ORDER — PANTOPRAZOLE SODIUM 40 MG PO TBEC
40.0000 mg | DELAYED_RELEASE_TABLET | Freq: Every day | ORAL | Status: DC
  Administered 2020-01-09 – 2020-01-10 (×2): 40 mg via ORAL
  Filled 2020-01-08 (×2): qty 1

## 2020-01-08 MED ORDER — INSULIN ASPART 100 UNIT/ML ~~LOC~~ SOLN
0.0000 [IU] | Freq: Three times a day (TID) | SUBCUTANEOUS | Status: DC
  Administered 2020-01-09 (×2): 5 [IU] via SUBCUTANEOUS
  Administered 2020-01-09 – 2020-01-10 (×2): 3 [IU] via SUBCUTANEOUS

## 2020-01-08 MED ORDER — SODIUM CHLORIDE 0.9% FLUSH
3.0000 mL | Freq: Once | INTRAVENOUS | Status: AC
  Administered 2020-01-08: 3 mL via INTRAVENOUS

## 2020-01-08 MED ORDER — ACETAMINOPHEN 650 MG RE SUPP: 650.0000 mg | Freq: Four times a day (QID) | RECTAL | Status: DC | PRN

## 2020-01-08 MED ORDER — FENOFIBRATE 160 MG PO TABS
160.0000 mg | ORAL_TABLET | Freq: Every day | ORAL | Status: DC
  Administered 2020-01-09 – 2020-01-10 (×2): 160 mg via ORAL
  Filled 2020-01-08 (×3): qty 1

## 2020-01-08 MED ORDER — ACETAMINOPHEN 325 MG PO TABS: 650.0000 mg | ORAL_TABLET | Freq: Four times a day (QID) | ORAL | Status: DC | PRN

## 2020-01-08 NOTE — ED Notes (Signed)
Report attempted 

## 2020-01-08 NOTE — ED Triage Notes (Signed)
Pt was with his son when he suddenly developed slurred speech and facial droop. Pt has history of stroke, so the son called [facility].

## 2020-01-08 NOTE — H&P (Signed)
Date: 01/08/2020               Patient Name:  Samuel Houston MRN: [id number]  DOB: May 10, 1946 Age / Sex: 74 y.o., male   PCP: [name], MD         Medical Service: Internal Medicine Teaching Service         Attending Physician: Dr. Reymundo Poll, MD    First Contact: Dr. Ephriam Knuckles Pager: [phone]  Second Contact: Dr. Gwyneth Revels Pager: [phone]       After Hours (After 5p/  First Contact Pager: [phone]  weekends / holidays): Second Contact Pager: [phone]   Chief Complaint: generalized weakness, syncope  History of Present Illness: This is a 74 year old male with a history of DM, HTN, CAD s/p CABG , hepatosteatosis, pacemaker for sinus pauses, and hx of CVAs who presents with generalized weakness and syncope.   74 yo male with CAD s/p CABG, pacemaker for sinus pauses, HTN, and a history of CVAs resulting in mild left sided hemiparesis. Presented to [facility] on 01/08/20 for syncopal episode that occurred while working on his car with his son. Son witnessed event--notes that pt said he did not feel good and became diaphoretic and sat down on chair with generalized weakness. Difficult to ascertain precise events after sitting on the chair however seems that he did not have LOC. EMS was called and pt notes awakening in the ambulance feeling fine after receiving IVF. Denies bowel/bladder incontinence. Importantly, pt notes diarrhea that began this morning. Previous similar events which were had negative workups and were determined to be secondary to dehydration.  Patient has fairly significant cardiac history including:  -CABG in 2012.  Left heart cath in April 2021 revealed multivessel coronary artery disease with occluded SVG to RCA for which medical therapy was advised.  They considered PCI however patient's not a DAPT candidate due to his recent GI bleed. -Ischemic dilated cardiomyopathy.  Ejection fraction has decreased fairly significantly over the last couple of years with most  recent EF being 30 to 35% -He also had a pacemaker placed couple years ago due to sinus pauses   Meds:  Aspirin 81 mg daily Fenofibrate 160 mg daily Ferrous sulfate 325 mg 2 times daily Folic acid 1 mg daily Glipizide 5 mg twice daily Lisinopril 10 mg daily Metformin 1000 mg twice daily Metoprolol tartrate 25 mg twice daily Omega-3 fatty acids Prilosec 40 mg daily Crestor 10 mg daily Flomax 0.4 mg daily   Allergies: Allergies as of 01/08/2020 - Review Complete 01/08/2020  Allergen Reaction Noted  . Ascorbate Other (See Comments) 12/05/2015  . Penicillins Rash 09/18/2011   Past Medical History:  Diagnosis Date  . CAD (coronary artery disease)    s/p CABG  . Diabetes mellitus   . Hypertension   . 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 Diabetes Mother   . Hypertension Mother   . Heart attack Father   . Heart disease Father   . Diabetes Maternal Grandmother   . Heart attack Paternal Grandfather   . Heart disease Paternal Grandfather      Social History:  Resides at home.  Denies current tobacco, alcohol or drug use. Review of Systems: A complete ROS was negative except as per HPI.   Physical Exam: Blood pressure (!) 155/99, pulse 84,  temperature 98.5 F (36.9 C), temperature source Oral, resp. rate 20, height 5\' 8"  (1.727 m), weight 87.5 kg, SpO2 97 %.  General: In no acute distress HEENT: Head atraumatic Cardiac: Regular rate and rhythm.  No peripheral edema. Lungs: Expiratory wheezes.  Breathing comfortably on room air. Abdomen: Soft nontender Skin: No rash Neuro: Alert and oriented.  Cranial nerves II through XII intact.  Bilateral upper and lower extremity strength equal and 5 out of 5.  Cerebellar function intact.  EKG: Sinus rhythm.  Does have ischemic changes however no prior EKGs to compare to.  CXR:  Cardiomegaly, low lung volumes.  Otherwise normal.  CT head: IMPRESSION: 1. No evidence of acute intracranial abnormality. ASPECTS 10. 2. Redemonstrated chronic lacunar infarcts within the right basal ganglia and left cerebellum. 3. Stable mild background generalized parenchymal atrophy and chronic small vessel ischemic disease.  Assessment & Plan by Problem: Active Problems:   Syncope  Syncope.  Secondary to volume depletion in the setting of diarrhea and strenuous work. Evaluated by neurology.  Head CT on admission unremarkable.  Low suspicion for CVA. No acute focal deficits appreciated. Admitting for further syncopal work-up. AKI secondary to volume depletion Plan Orthostatic vitals Pacemaker interrogation per cardiology IV fluids with LR at 50 mL/h.  Will need to tread carefully given his degree of heart failure. Repeat labs in the a.m.  Heart failure with reduced ejection fraction Coronary artery disease status post CABG Pacemaker placement secondary to sinus pauses No history, physical exam, or diagnostic findings to suggest volume overload at this time. Plan: Continue aspirin, fenofibrate, metoprolol, Crestor.  Daily weights, strict I&O, telemetry monitoring  Type 2 diabetes.  Managed on Metformin and glipizide at home.  Held on admission for AKI.  Placed on sliding scale.  CBG monitoring.  Urinary retention due to BPH.  Continue Flomax  ---------------------------  Code: Full VTE prophylaxis: Lovenox Diet: Cardiac Dispo: Admit patient to Observation with expected length of stay less than 2 midnights.   ------------------------------  Signed: Mitzi Hansen, MD 01/08/2020, 7:34 PM

## 2020-01-08 NOTE — ED Notes (Signed)
Tele Dinner ordered 

## 2020-01-08 NOTE — ED Provider Notes (Signed)
[facility]   CSN: [id number] Arrival date & time: 01/08/20  1424  An emergency department physician performed an initial assessment on this suspected stroke patient at 1426.  History Chief Complaint  Patient presents with  . Code Stroke    Samuel Houston is a 74 y.o. male.  HPI Patient was working with his son on a car doing some little variance.  Patient reports that he felt fine.  He reports suddenly he started to feel bad.  He had a combination of general weakness and feeling as if he might pass out.  He reports that he continued to recognize his surroundings.  He reports his vision did seem somewhat blurred at that time.  He denies he experienced headache, chest pain or shortness of breath.  His son reports that he noted that his father all of a sudden looked like he was about to pass out or had a vague distant look in his eyes.  He could tell he was not well.  He helped him to sitting down position.  He reports that he was extremely weak and lost all of his color.  He became very pale.  He reports his eyes were somewhat rolled back but he was not completely out.  He reports that his right arm had gone limp and his speech was slurred.  Patient son advises he has seen this similar presentation when the patient had a stroke about a year ago.  Symptoms lasted about 10 minutes.  The began to resolve upon EMS arrival.  Patient reports he thinks all the symptoms resolved during transport.  Patient son reports that the patient is now back to baseline.  Patient has a pacemaker.  He was evaluated as a code stroke by Dr. Otelia LimesLindzen.    Past Medical History:  Diagnosis Date  . CAD (coronary artery disease)    s/p CABG  . Diabetes mellitus   . Hypertension   . 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 Syncope  01/08/2020  . Carotid stenosis 04/28/2016  . Near syncope 04/26/2016  . Sinus pause 04/26/2016  . Dizziness 04/25/2016  . Slurred speech   . Faintness   . Iron deficiency anemia 09/12/2015  . 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 Hx of CABG 09/11/2015  . CAD (coronary artery disease) 09/11/2015  . Hyperlipidemia 12/17/2013  . HTN (hypertension), benign 12/17/2013  . 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 A-V CARDIAC PACEMAKER INSERTION Left 04/26/2016   [facility]  model [id number] (serial number  [id number] )  . CARDIAC CATHETERIZATION N/A 04/26/2016   Procedure: Left Heart Cath and Cors/Grafts Angiography;  Surgeon: Corky CraftsJayadeep S Varanasi, MD;  [facility];  Service: Cardiovascular;  Laterality: N/A;  . CORONARY ARTERY BYPASS GRAFT  08-07-11  . EP IMPLANTABLE DEVICE N/A 04/26/2016   Procedure: Pacemaker Implant;  Surgeon: Will Jorja LoaMartin Camnitz, MD;  Location: [facility] INVASIVE CV LAB;  Service: Cardiovascular;  Laterality: N/A;       Family History  Problem Relation Age of Onset  . Diabetes Mother   . Hypertension Mother   . Heart attack Father   . Heart disease Father   . Diabetes Maternal Grandmother   . Heart attack Paternal  Grandfather   . Heart disease Paternal Grandfather     Social History   Tobacco Use  . Smoking status: Former Smoker    Packs/day: 3.00    Types: Cigarettes    Quit date: 08/02/2004    Years since quitting: 15.4  . Smokeless tobacco: Never Used  Substance Use Topics  . Alcohol use: No  . Drug use: No    Home Medications Prior to Admission medications   Medication Sig Start Date End Date Taking? Authorizing Provider  aspirin EC 81 MG tablet Take 81 mg by mouth daily.    [provider]  clopidogrel (PLAVIX) 75 MG tablet Take 1 tablet (75 mg total) by mouth daily. 04/27/16   Barrett, Joline Salt, PA-C  fenofibrate 160 MG tablet Take  160 mg by mouth daily. 02/29/16   [provider]  ferrous sulfate 325 (65 FE) MG EC tablet Take 325 mg by mouth 2 (two) times daily with a meal.     [provider]  folic acid (FOLVITE) 1 MG tablet Take 1 mg by mouth daily.    [provider]  glipiZIDE (GLUCOTROL) 5 MG tablet Take 5 mg by mouth 2 (two) times daily before a meal.    [provider]  lisinopril (PRINIVIL,ZESTRIL) 10 MG tablet Take 1 tablet (10 mg total) by mouth daily. 12/31/17 03/31/18  Camnitz, Andree Coss, MD  metFORMIN (GLUCOPHAGE) 1000 MG tablet Take 1,000 mg by mouth 2 (two) times daily. 02/29/16   [provider]  metoprolol tartrate (LOPRESSOR) 25 MG tablet Take 25 mg by mouth 2 (two) times daily. 08/31/15   [provider]  Multiple Vitamins-Minerals (ZINC PO) Take 1 tablet by mouth daily.    [provider]  Omega-3 Fatty Acids (FISH OIL PO) Take 1 capsule by mouth 2 (two) times daily.    [provider]  omeprazole (PRILOSEC) 40 MG capsule Take 40 mg by mouth daily. 09/08/15   [provider]  rosuvastatin (CRESTOR) 20 MG tablet Take 10 mg by mouth daily. 12/03/17   [provider]  tamsulosin (FLOMAX) 0.4 MG CAPS capsule Take 0.4 mg by mouth daily. 03/13/16   [provider]  vitamin B-12 (CYANOCOBALAMIN) 1000 MCG tablet Take 1,000 mcg by mouth daily.    [provider]    Allergies    Ascorbate and Penicillins  Review of Systems   Review of Systems 10 Systems reviewed and are negative for acute change except as noted in the HPI.  Physical Exam Updated Vital Signs BP (!) 155/99   Pulse 84   Temp 98.5 F (36.9 C) (Oral)   Resp 20   Ht 5\' 8"  (1.727 m)   Wt 87.5 kg   SpO2 97%   BMI 29.33 kg/m   Physical Exam Constitutional:      Comments: Alert nontoxic no respiratory distress.  HENT:     Head: Normocephalic and atraumatic.     Mouth/Throat:     Mouth: Mucous membranes are moist.     Pharynx:  Oropharynx is clear.  Eyes:     Extraocular Movements: Extraocular movements intact.     Conjunctiva/sclera: Conjunctivae normal.     Pupils: Pupils are equal, round, and reactive to light.  Cardiovascular:     Rate and Rhythm: Normal rate and regular rhythm.     Pulses: Normal pulses.     Heart sounds: Normal heart sounds.  Pulmonary:     Effort: Pulmonary effort is normal.     Breath  sounds: Normal breath sounds.     Comments: Pacer left anterior chest wall Abdominal:     General: There is no distension.     Palpations: Abdomen is soft.     Tenderness: There is no abdominal tenderness. There is no guarding.  Musculoskeletal:        General: No swelling or tenderness. Normal range of motion.  Skin:    General: Skin is warm and dry.  Neurological:     General: No focal deficit present.     Mental Status: He is oriented to person, place, and time.     Cranial Nerves: No cranial nerve deficit.     Sensory: No sensory deficit.     Motor: No weakness.     Coordination: Coordination normal.     Comments: Mental status is clear.  Speech is clear and intelligible.  Normal content.  Strength 5\5 bilaterally.  Lower extremity strength 5\5 against resistance.  Sensation intact to light touch upper and lower extremity.  Psychiatric:        Mood and Affect: Mood normal.     ED Results / Procedures / Treatments   Labs (all labs ordered are listed, but only abnormal results are displayed) Labs Reviewed  APTT - Abnormal; Notable for the following components:      Result Value   aPTT 23 (*)    All other components within normal limits  CBC - Abnormal; Notable for the following components:   RBC 3.76 (*)    Hemoglobin 10.4 (*)    HCT 34.7 (*)    All other components within normal limits  COMPREHENSIVE METABOLIC PANEL - Abnormal; Notable for the following components:   CO2 18 (*)    Glucose, Bld 180 (*)    Creatinine, Ser 1.73 (*)    Total Protein 6.4 (*)    AST 64 (*)    ALT 68  (*)    Alkaline Phosphatase 18 (*)    GFR calc non Af Amer 38 (*)    GFR calc Af Amer 44 (*)    All other components within normal limits  I-STAT CHEM 8, ED - Abnormal; Notable for the following components:   BUN 24 (*)    Creatinine, Ser 1.60 (*)    Glucose, Bld 174 (*)    Calcium, Ion 1.10 (*)    TCO2 20 (*)    Hemoglobin 10.5 (*)    HCT 31.0 (*)    All other components within normal limits  RESPIRATORY PANEL BY RT PCR (FLU A&B, COVID)  PROTIME-INR  DIFFERENTIAL  CBC  BASIC METABOLIC PANEL  CBG MONITORING, ED  TROPONIN I (HIGH SENSITIVITY)  TROPONIN I (HIGH SENSITIVITY)    EKG EKG Interpretation  Date/Time:  Saturday Jan 08 2020 15:04:52 EDT Ventricular Rate:  72 PR Interval:    QRS Duration: 158 QT Interval:  420 QTC Calculation: 460 R Axis:   -176 Text Interpretation: Sinus rhythm RBBB and LPFB ST depr, consider ischemia, anterolateral lds agree, ischemic changes new compared to previous Confirmed by Arby Barrette [phone]) on 01/08/2020 3:17:27 PM   Radiology DG Chest Port 1 View  Result Date: 01/08/2020 CLINICAL DATA:  Slurred speech EXAM: PORTABLE CHEST 1 VIEW COMPARISON:  05/16/2019 FINDINGS: There is a stable dual chamber left-sided pacemaker place. Heart size is stable. Patient is status median sternotomy. The lung volumes are somewhat low. Bibasilar atelectasis is noted. There is no pneumothorax. No large pleural effusion. No acute osseous abnormality. IMPRESSION: No active disease. Electronically Signed  By: Katherine Mantle M.D.   On: 01/08/2020 16:05   CT HEAD CODE STROKE WO CONTRAST  Result Date: 01/08/2020 CLINICAL DATA:  Code stroke. Neuro deficit, acute, stroke suspected. Possible stroke. Slurred speech, right facial droop. EXAM: CT HEAD WITHOUT CONTRAST TECHNIQUE: Contiguous axial images were obtained from the base of the skull through the vertex without intravenous contrast. COMPARISON:  Brain MRI 09/23/2017, head CT 05/16/2019 FINDINGS: Brain:  Redemonstrated chronic lacunar infarcts within the right basal ganglia and left cerebellum. Mild ill-defined hypoattenuation within the cerebral white matter is nonspecific, but consistent with chronic small vessel ischemic disease. Stable, mild generalized parenchymal atrophy. There is no acute intracranial hemorrhage. No acute demarcated cortical infarct. No extra-axial fluid collection. No evidence of intracranial mass. No midline shift. Vascular: No hyperdense vessel.  Atherosclerotic calcifications. Skull: Normal. Negative for fracture or focal lesion. Sinuses/Orbits: Visualized orbits show no acute finding. Left maxillary sinus mucous retention cyst. Mild ethmoid sinus mucosal thickening. No significant mastoid effusion. ASPECTS [facility] Stroke Program Early CT Score) - Ganglionic level infarction (caudate, lentiform nuclei, internal capsule, insula, M1-M3 cortex): 7 - Supraganglionic infarction (M4-M6 cortex): 3 Total score (0-10 with 10 being normal): 10 These results were communicated to Dr. Otelia Limes At 2:50 pmon 5/8/2021by text page via the [facility] messaging system. IMPRESSION: 1. No evidence of acute intracranial abnormality. ASPECTS 10. 2. Redemonstrated chronic lacunar infarcts within the right basal ganglia and left cerebellum. 3. Stable mild background generalized parenchymal atrophy and chronic small vessel ischemic disease. Electronically Signed   By: Jackey Loge DO   On: 01/08/2020 14:51    Procedures Procedures (including critical care time) CRITICAL CARE Performed by: Arby Barrette   Total critical care time: 30 minutes  Critical care time was exclusive of separately billable procedures and treating other patients.  Critical care was necessary to treat or prevent imminent or life-threatening deterioration.  Critical care was time spent personally by me on the following activities: development of treatment plan with patient and/or surrogate as well as nursing, discussions with  consultants, evaluation of patient's response to treatment, examination of patient, obtaining history from patient or surrogate, ordering and performing treatments and interventions, ordering and review of laboratory studies, ordering and review of radiographic studies, pulse oximetry and re-evaluation of patient's condition. Medications Ordered in ED Medications  aspirin EC tablet 81 mg (has no administration in time range)  fenofibrate tablet 160 mg (has no administration in time range)  metoprolol tartrate (LOPRESSOR) tablet 25 mg (has no administration in time range)  rosuvastatin (CRESTOR) tablet 10 mg (has no administration in time range)  pantoprazole (PROTONIX) EC tablet 40 mg (has no administration in time range)  tamsulosin (FLOMAX) capsule 0.4 mg (has no administration in time range)  ferrous sulfate EC tablet 325 mg (has no administration in time range)  folic acid (FOLVITE) tablet 1 mg (has no administration in time range)  vitamin B-12 (CYANOCOBALAMIN) tablet 1,000 mcg (has no administration in time range)  enoxaparin (LOVENOX) injection 40 mg (has no administration in time range)  acetaminophen (TYLENOL) tablet 650 mg (has no administration in time range)    Or  acetaminophen (TYLENOL) suppository 650 mg (has no administration in time range)  insulin aspart (novoLOG) injection 0-15 Units (has no administration in time range)  sodium chloride flush (NS) 0.9 % injection 3 mL (3 mLs Intravenous Given 01/08/20 1517)    ED Course  I have reviewed the triage vital signs and the nursing notes.  Pertinent labs & imaging results that were  available during my care of the patient were reviewed by me and considered in my medical decision making (see chart for details).  Clinical Course as of Jan 07 1841  Sat Jan 08, 2020  1707 Consult: Reviewed with Dr. Cheral Marker.  Advises for admission for completing stroke work-up.  Has suspicion for possible hypotensive episode.    [MP]    Clinical  Course User Index [MP] Charlesetta Shanks, MD   MDM Rules/Calculators/A&P                      Patient presents with episode as outlined above.  Patient son observed slurred speech and right arm weakness.  Possible stroke.  Patient does have history of similar.  However blood pressure was also hypotensive on EMS arrival.  Patient was diaphoretic and pale.  Possibly result of hypotension.  This has improved since arrival to the emergency department.  Patient also has pacemaker.  Consideration for possible dysrhythmia.  Patient's EKG does show ischemic appearance but no STEMI,  first troponin is within normal limits.  No comparison tracing to view.  There is a text interpretation in epic which describes lateral T wave ischemic appearance.  Cannot determine degree of change.  Patient denies he experienced any chest pain back pain shoulder pain or jaw pain.  He has none at this time.  We will also obtain troponin for rule out.  At this time patient is alert and appropriate.  Symptoms have resolved.  Vital signs stable.  Stable for admission. Final Clinical Impression(s) / ED Diagnoses Final diagnoses:  Slurred speech  Near syncope  Right arm weakness  Hypotension, unspecified hypotension type    Rx / DC Orders ED Discharge Orders    None       Charlesetta Shanks, MD 01/08/20 1844

## 2020-01-08 NOTE — ED Provider Notes (Signed)
MSE was initiated and I personally evaluated the patient and placed orders (if any) at  2:32 PM on Jan 08, 2020.  The patient appears stable so that the remainder of the MSE may be completed by another provider.  Patient presented as a code stroke.  He was also noted to be hypotensive initially by EMS.  Patient was assessed at the EMS bridge.  Patient noted to have speech disturbance.  He does have history of prior strokes.  Dr. Otelia Limes is at the bedside.  Patient's airway appears stable.   Plan on stat head CT.   Linwood Dibbles, MD 01/08/20 [phone]

## 2020-01-08 NOTE — Consult Note (Addendum)
Referring Physician: Dr. Donnald Garre    Chief Complaint: Slurred speech with right facial droop  HPI: Samuel Houston is an 74 y.o. male who presents to the ED via EMS as a Code Stroke. 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 The son later endorsed his father appearing to him as though he was going to pass out with a "vague distant look in his eyes" and "could tell that he was not well", appeared pale, so he helped him down to a sitting position noting that his father was extremely weak at that time. He did have some blurred vision at that time and son noted his eyes rolled back in his head somewhat. Son further described his father's right arm going limp and slurred speech. Symptoms lasted about 10 minutes and began to resolve upon EMS arrival. The patient was diaphoretic on arrival, but had no SOB or CP. No abdominal pain. He had no headache. He did have diarrhea this AM.   Per EMS, his CBG on scene was 187. EMS felt that he had some left facial droop, slurred speech and LUE weakness. A Code Stroke was called in the field. His BP was low at 88/40 on scene. This had improved somewhat to 104/52 by the time of arrival to the ED.   Of note, there was no shaking or other seizure-like activity, no B/B incontinence.   He endorses having had several episodes of syncope in the past, with associated diaphoresis and feeling of being very hot. A total of 3 episodes have occurred over the past 1.5 years and he had been told at the time that he was dehydrated and also that his electrolytes were abnormal.   The patient's PMHx includes pacemaker implanted for management of symptomatic bradycardia with syncope, DM, HTN, CAD s/p CABG, and fatty liver.    After receiving fluids in the ED, he reports feeling significantly better.   LSN: [id number] tPA Given: No: No focal  deficits on exam.  Past Medical History:  Diagnosis Date  . CAD (coronary artery disease)    s/p CABG  . Diabetes mellitus   . Hypertension   . 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 A-V CARDIAC PACEMAKER INSERTION Left 04/26/2016   [place] Jude Medical Assurity MRI  model [id number] (serial number  [id number] )  . CARDIAC CATHETERIZATION N/A 04/26/2016   Procedure: Left Heart Cath and Cors/Grafts Angiography;  Surgeon: [name], MD;  Location: [facility] INVASIVE CV LAB;  Service: Cardiovascular;  Laterality: N/A;  . CORONARY ARTERY BYPASS GRAFT  08-07-11  . EP IMPLANTABLE DEVICE N/A 04/26/2016   Procedure: Pacemaker Implant;  Surgeon: [name], MD;  Location: [facility] INVASIVE CV LAB;  Service: Cardiovascular;  Laterality: N/A;    Family History  Problem Relation Age of Onset  . Diabetes Mother   . Hypertension Mother   . Heart attack Father   . Heart disease Father   . Diabetes Maternal Grandmother   . Heart attack Paternal Grandfather   . Heart disease Paternal Grandfather    Social History:  reports that he quit smoking about 15 years ago. His smoking use included cigarettes. He smoked 3.00 packs per day. He has never  used smokeless tobacco. He reports that he does not drink alcohol or use drugs.  Allergies:  Allergies  Allergen Reactions  . Ascorbate Other (See Comments)    Causes sore in mouth and irritates esophagus  . Penicillins Rash    Has patient had a PCN reaction causing immediate rash, facial/tongue/throat swelling, SOB or lightheadedness with hypotension: Yes Has patient had a PCN reaction causing severe rash involving mucus membranes or skin necrosis: No Has patient had a PCN reaction that required hospitalization: No Has patient had a PCN reaction occurring within the last 10 years: No If all of the above answers are "NO", then may  proceed with Cephalosporin use.     Home Medications: No current facility-administered medications on file prior to encounter.   Current Outpatient Medications on File Prior to Encounter  Medication Sig Dispense Refill  . aspirin EC 81 MG tablet Take 81 mg by mouth daily.    . clopidogrel (PLAVIX) 75 MG tablet Take 1 tablet (75 mg total) by mouth daily. 30 tablet 11  . fenofibrate 160 MG tablet Take 160 mg by mouth daily.    . ferrous sulfate 325 (65 FE) MG EC tablet Take 325 mg by mouth 2 (two) times daily with a meal.     . folic acid (FOLVITE) 1 MG tablet Take 1 mg by mouth daily.    Marland Kitchen glipiZIDE (GLUCOTROL) 5 MG tablet Take 5 mg by mouth 2 (two) times daily before a meal.    . lisinopril (PRINIVIL,ZESTRIL) 10 MG tablet Take 1 tablet (10 mg total) by mouth daily. 90 tablet 3  . metFORMIN (GLUCOPHAGE) 1000 MG tablet Take 1,000 mg by mouth 2 (two) times daily.    . metoprolol tartrate (LOPRESSOR) 25 MG tablet Take 25 mg by mouth 2 (two) times daily.    . Multiple Vitamins-Minerals (ZINC PO) Take 1 tablet by mouth daily.    . Omega-3 Fatty Acids (FISH OIL PO) Take 1 capsule by mouth 2 (two) times daily.    Marland Kitchen omeprazole (PRILOSEC) 40 MG capsule Take 40 mg by mouth daily.    . rosuvastatin (CRESTOR) 20 MG tablet Take 10 mg by mouth daily.    . tamsulosin (FLOMAX) 0.4 MG CAPS capsule Take 0.4 mg by mouth daily.    . vitamin B-12 (CYANOCOBALAMIN) 1000 MCG tablet Take 1,000 mcg by mouth daily.       ROS: As per HPI. All other systems negative.   Physical Examination: There were no vitals taken for this visit.  HEENT: Diaphoretic. [place]/AT. Lungs: Respirations unlabored Skin: Diaphoretic and cool skin of proximal upper extremities and chest.   Neurologic Examination: Mental Status: Awake and alert. Some slurring of speech noted. In this context, speech fluent with intact comprehension and naming. Able to follow all commands without difficulty. Able to answer all questions regarding his  recent symptoms, but appears fatigued and anxious.  Cranial Nerves: II:  PERRL. Visual fields intact.  III,IV, VI: No ptosis. EOMI.  V,VII: No definite facial droop. FT sensation equal bilaterally.  VIII: Hearing intact to voice IX,X: No hypophonia XI: Head midline XII: No lingual dysarthria Motor: 4 to 4+/5 strength x 4 with slightly less brisk and forceful movement to LUE and LLE relative to the right.  Sensory: Light touch intact throughout, bilaterally. No extinction to DSS.  Deep Tendon Reflexes:  Normoactive.  Cerebellar: No ataxia noted.  Gait: Deferred  Results for orders placed or performed during the hospital encounter of 01/08/20 (from the past 48 hour(s))  I-stat chem 8, ED     Status: Abnormal   Collection Time: 01/08/20  2:31 PM  Result Value Ref Range   Sodium 137 135 - 145 mmol/L   Potassium 4.8 3.5 - 5.1 mmol/L   Chloride 107 98 - 111 mmol/L   BUN 24 (H) 8 - 23 mg/dL   Creatinine, Ser 1.60 (H) 0.61 - 1.24 mg/dL   Glucose, Bld 174 (H) 70 - 99 mg/dL    Comment: Glucose reference range applies only to samples taken after fasting for at least 8 hours.   Calcium, Ion 1.10 (L) 1.15 - 1.40 mmol/L   TCO2 20 (L) 22 - 32 mmol/L   Hemoglobin 10.5 (L) 13.0 - 17.0 g/dL   HCT 31.0 (L) 39.0 - 52.0 %   No results found.  Assessment: 74 y.o. male presenting as a Code Stroke with equivocal left facial and upper extremity weakness noted by EMS in the context of sudden onset of diaphoresis and generalized weakness.  1. On EMS arrival, he was still hypotensive and continued to be diaphoretic.  2. Overall exam findings and history are most consistent with acute hypotension uncovering a latent neurological deficit form his old right basal ganglia lacunar infarction. Based on overall constellation of findings, acute stroke is significantly less likely.   3. CT head with no evidence of acute intracranial abnormality. ASPECTS 10. Redemonstrated chronic lacunar infarcts within the right  basal ganglia and left cerebellum.  Recommendations: 1. Pacemaker interrogation.  2. Continue IVF.  3. If he remains without neurological deficits, no MRI is necessary at this time.  4. May need further cardiac work up. Will defer to ED team.  5. Continue ASA, Plavix and Crestor  @Electronically  signed: Dr. Kerney Elbe  01/08/2020, 2:34 PM

## 2020-01-09 ENCOUNTER — Encounter (HOSPITAL_COMMUNITY): Payer: Self-pay | Admitting: Internal Medicine

## 2020-01-09 DIAGNOSIS — R197 Diarrhea, unspecified: Secondary | ICD-10-CM | POA: Diagnosis not present

## 2020-01-09 DIAGNOSIS — R55 Syncope and collapse: Secondary | ICD-10-CM | POA: Diagnosis not present

## 2020-01-09 LAB — BASIC METABOLIC PANEL
Anion gap: 8 (ref 5–15)
BUN: 18 mg/dL (ref 8–23)
CO2: 23 mmol/L (ref 22–32)
Calcium: 9.1 mg/dL (ref 8.9–10.3)
Chloride: 106 mmol/L (ref 98–111)
Creatinine, Ser: 1.51 mg/dL — ABNORMAL HIGH (ref 0.61–1.24)
GFR calc Af Amer: 52 mL/min — ABNORMAL LOW (ref >60)
GFR calc non Af Amer: 45 mL/min — ABNORMAL LOW (ref >60)
Glucose, Bld: 192 mg/dL — ABNORMAL HIGH (ref 70–99)
Potassium: 4 mmol/L (ref 3.5–5.1)
Sodium: 137 mmol/L (ref 135–145)

## 2020-01-09 LAB — GLUCOSE, CAPILLARY
Glucose-Capillary: 187 mg/dL — ABNORMAL HIGH (ref 70–99)
Glucose-Capillary: 204 mg/dL — ABNORMAL HIGH (ref 70–99)
Glucose-Capillary: 228 mg/dL — ABNORMAL HIGH (ref 70–99)
Glucose-Capillary: 223 mg/dL — ABNORMAL HIGH (ref 70–99)

## 2020-01-09 LAB — CBC
HCT: 32.3 % — ABNORMAL LOW (ref 39.0–52.0)
Hemoglobin: 10.1 g/dL — ABNORMAL LOW (ref 13.0–17.0)
MCH: 28.1 pg (ref 26.0–34.0)
MCHC: 31.3 g/dL (ref 30.0–36.0)
MCV: 90 fL (ref 80.0–100.0)
Platelets: 143 10*3/uL — ABNORMAL LOW (ref 150–400)
RBC: 3.59 MIL/uL — ABNORMAL LOW (ref 4.22–5.81)
RDW: 14.7 % (ref 11.5–15.5)
WBC: 4.3 10*3/uL (ref 4.0–10.5)
nRBC: 0 % (ref 0.0–0.2)

## 2020-01-09 NOTE — Progress Notes (Signed)
NAME:  Samuel Houston, MRN:  [id number], DOB:  10/21/1945, LOS: 0 ADMISSION DATE:  01/08/2020   Brief History  74 yo male with HFrEF (EF 30-35%), CAD s/p CABG, pacemaker 2/2 sinus pauses, HTN, history of CVAs, T2DM who presented to [facility] on 01/08/20 for a syncopal event that occurred on day of admission. Admitted to IMTS for syncopal workup.  Patient has fairly significant cardiac history including:  -CABG in 2012.  Left heart cath in April 2021 revealed multivessel coronary artery disease with occluded SVG to RCA for which medical therapy was advised.  They considered PCI however patient's not a DAPT candidate due to his GI bleed. -Ischemic dilated cardiomyopathy.  Ejection fraction has decreased fairly significantly over the last couple of years with most recent EF being 30 to 35% -He also had a pacemaker placed couple years ago due to sinus pauses  On chart review confirms recent ED visits for similar events that are believed to be secondary to orthostatic hypotension 2/2 diarrhea.   GI bleed from a mallory weiss tear that was subsequently cauterized and clipped on 07/13/19.Marland Kitchen Evaluated at that time by GI who documents recent colonoscopy in the past 4 years that was normal aside from moderate hemorrhoids. Subjective  No overnight events. Discussed how the syncopal event was likely 2/2 dehydration 2/2 diarrhea. He notes that the diarrhea has been an intermittent ongoing issue for several months but had been particularly bad this last week. Significant Hospital Events   5/8>admission for syncope  Objective   Blood pressure (!) 153/80, pulse 86, temperature 98.1 F (36.7 C), temperature source Oral, resp. rate 15, height 5\' 8"  (1.727 m), weight 87.5 kg, SpO2 95 %.     Intake/Output Summary (Last 24 hours) at 01/09/2020 0512 Last data filed at 01/08/2020 2242 Gross per 24 hour  Intake --  Output 600 ml  Net -600 ml   Filed Weights   01/08/20 1459  Weight: 87.5 kg     Examination: GENERAL: in no acute distress CARDIAC: heart RRR. No peripheral edema.  PULMONARY: Lung sounds clear to auscultation. NEURO: a/o. CNII-XII intact. 5/5 strength equal in bilateral upper and lower extremities. GI: bs active. No tenderness.  Consults:  neurology  Significant Diagnostic Tests:  5/8 CT head>no acute abnormalities. Chronic lacunar infarcts within right basal ganglia and left cerebellum. Mild generalized parenchymal atrophy and chronic small vessel ischemic disease  5/8 CXR> no acute process  Micro Data:  n/a  Antimicrobials:  n/a  Labs    CBC Latest Ref Rng & Units 01/08/2020 01/08/2020 09/23/2017  WBC 4.0 - 10.5 K/uL - 6.3 5.9  Hemoglobin 13.0 - 17.0 g/dL 10.5(L) 10.4(L) 12.7(L)  Hematocrit 39.0 - 52.0 % 31.0(L) 34.7(L) 38.3(L)  Platelets 150 - 400 K/uL - 163 158   BMP Latest Ref Rng & Units 01/08/2020 01/08/2020 09/23/2017  Glucose 70 - 99 mg/dL 09/25/2017) 468(E) 321(Y)  BUN 8 - 23 mg/dL 248(G) 21 14  Creatinine 0.61 - 1.24 mg/dL 50(I) 3.70(W) 8.88(B)  Sodium 135 - 145 mmol/L 137 137 137  Potassium 3.5 - 5.1 mmol/L 4.8 4.8 4.4  Chloride 98 - 111 mmol/L 107 105 103  CO2 22 - 32 mmol/L - 18(L) 22  Calcium 8.9 - 10.3 mg/dL - 9.1 9.5    Summary  74 yo male with significant cardiac history including HFrEF (EF 30-35%), CAD s/p CABG, sinus pauses s/p pacemaker, HTN, CVAs, and T2DM admitted to IMTS for syncopal workup.  Assessment & Plan:  Active Problems:  Syncope  Syncope. Likely 2/2 volume depletion in setting of diarrhea. -Hypovolemia most likely and is supported by history of diarrhea that morning as well as his resolution of symptoms with IVF that were given by EMS. AKI furthermore supports this. -No acute findings on head CT however CVA can not be ruled out. Discussed MRI compatibility with radiology however will need cardiology on board. After discussion with neurology, they do not feel MRI is necessary. I agree as he has no focal symptoms and  event is more likely to be hypovolemia related. -Dysthrymia can not be ruled out at this time and I am attempting to contact them regarding interrogating the pacemaker. -ACS ruled out diagnostically with normal trops -EMS noted normal glucose Plan Orthostatic vitals ordered last night. Still awaiting this to be done Pacemaker interrogation per cardiology Will hold off on administering more fluids to avoid volume overloading him  Diarrhea. Broad ddx including medication adverse effect (metformin) vs bleed from barrett's. Unlikely to be infectious given the chronicity of it. Pt notes that he has been on metformin for several years and that he does not feel that the timeline necessarily correlates. More likely is the GI bleeding for which he follows with GI at [facility], most recently in March of 2021. Plan: will continue to hold metformin at discharge and have him follow up with his PCP. During that time, they will be able to evaluate if he has had ongoing diarrhea to help delineate.  Barrett's esopahagus with high grade dysplasia. Chart review indicates plans to undergo radioablative treatment with Dr. Newman Pies (GI) at [facility].  AKI. Baseline creatinine around 1.3. Creatinine 1.7 on admission. Received IVF overnight. Improving this morning. Will continue to have him hold metformin at discharge and have him follow up with his PCP for lab recheck.  HFrEF (30-35%). Follows with cardiology at [place]. Not appearing fluid overloaded at this time.  CAD s/p CABG Sinus pause s/p pacemaker HTN Plan: continue home medications. Asprin, fenofibrate, metoprolol, statin. Continue to hold ACEi. Cardiology to interrogate pacemaker  T2DM. On metformin and glipizide at home. Held on admission for AKI. Continue SSI. Adjust as necessary. Will have him continue to hold metformin at discharge and follow up with PCP.  Best practice:  CODE STATUS: FULL Diet: cardiac DVT for prophylaxis: lovenox Social  considerations/Family communication: son updated at bedside last night. Will reach out for update today. Dispo: likely stable for discharge pending cardiology pacemaker interrogation.   Mitzi Hansen, MD INTERNAL MEDICINE RESIDENT PGY-1 PAGER #: [phone] 01/09/20  5:12 AM

## 2020-01-09 NOTE — Discharge Summary (Addendum)
Name: Samuel Houston MRN: [id number] DOB: Dec 15, 1945 74 y.o. PCP: [name], MD  Date of Admission: 01/08/2020  2:26 PM Date of Discharge: 01/10/2020 Attending Physician: Dr. Debe Coder  Discharge Diagnosis: 1.  Syncope due to dehydration secondary to chronic intermittent diarrhea 2.  Heart failure with reduced ejection fraction (EF 30 to 35%) 3.  History of sinus pauses status post pacemaker placement.  History of dysrhythmias including V. Tach. 4.  Type 2 diabetes 5.  Barrett's esophagus 6.  Acute kidney injury on CKD 7. Chronic intermittent diarrhea 8. Chronic anemia/thrombocytopenia  Discharge Medications: Allergies as of 01/10/2020      Reactions   Ascorbate Other (See Comments)   Causes sore in mouth and irritates esophagus   Penicillins Rash   Has patient had a PCN reaction causing immediate rash, facial/tongue/throat swelling, SOB or lightheadedness with hypotension: Yes Has patient had a PCN reaction causing severe rash involving mucus membranes or skin necrosis: No Has patient had a PCN reaction that required hospitalization: No Has patient had a PCN reaction occurring within the last 10 years: No If all of the above answers are "NO", then may proceed with Cephalosporin use.      Medication List    STOP taking these medications   clopidogrel 75 MG tablet Commonly known as: PLAVIX   metFORMIN 1000 MG tablet Commonly known as: GLUCOPHAGE     TAKE these medications   aspirin EC 81 MG tablet Take 81 mg by mouth daily.   fenofibrate 160 MG tablet Take 160 mg by mouth daily.   ferrous sulfate 325 (65 FE) MG EC tablet Take 325 mg by mouth 2 (two) times daily with a meal.   finasteride 5 MG tablet Commonly known as: PROSCAR Take 5 mg by mouth daily.   FISH OIL PO Take 1 capsule by mouth 2 (two) times daily.   folic acid 1 MG tablet Commonly known as: FOLVITE Take 1 mg by mouth daily.   glipiZIDE 5 MG tablet Commonly known as:  GLUCOTROL Take 5 mg by mouth 2 (two) times daily before a meal.   lisinopril 10 MG tablet Commonly known as: ZESTRIL Take 1 tablet (10 mg total) by mouth daily.   metoprolol tartrate 25 MG tablet Commonly known as: LOPRESSOR Take 25 mg by mouth 2 (two) times daily.   omeprazole 40 MG capsule Commonly known as: PRILOSEC Take 40 mg by mouth daily.   rosuvastatin 20 MG tablet Commonly known as: CRESTOR Take 10 mg by mouth daily.   sitaGLIPtin 50 MG tablet Commonly known as: JANUVIA Take 50 mg by mouth daily.   tamsulosin 0.4 MG Caps capsule Commonly known as: FLOMAX Take 0.4 mg by mouth daily.   vitamin B-12 1000 MCG tablet Commonly known as: CYANOCOBALAMIN Take 1,000 mcg by mouth daily.   ZINC PO Take 1 tablet by mouth daily.       Disposition and follow-up:   Mr.Samuel Houston was discharged from [facility] in Stable condition.  At the hospital follow up visit please address:  1.  Syncopal event secondary to dehydration in setting of chronic intermittent diarrhea.  Unclear etiology of this diarrhea however differential should include medications (on Metformin) and hyper motility due to GI bleed.  Discontinued Metformin at time of discharge with instructions for him to follow-up with his PCP to discuss alternatives.  2.  Acute kidney injury.  Suspect to be secondary to dehydration and improved with IV fluids.  Please repeat a  BMP at time of follow-up to reevaluate.  3.  Type 2 diabetes.  Well-controlled on glipizide, Metformin and Januvia.  As stated above, we held Metformin at time of discharge to see if his diarrhea improves with that.  Please reevaluate blood sugars at time of follow-up.  4. Chronic anemia with thrombocytopenia. Will defer more thorough investigation to PCP.  Labs / imaging needed at time of follow-up: BMP.  Pending labs/ test needing follow-up: None  Follow-up Appointments: Follow-up Information    [name], MD  Follow up.   Specialty: Gastroenterology Contact information: [id number] [employer] [place] [address] [phone]        [name], PA-C Follow up on 02/02/2020.   Specialty: Physician Assistant Why: at [phone] for pacemaker follow up (annual/overdue) Contact information: [place] [address] [phone]           Hospital Course: 74 year old male with past medical history of heart failure with reduced ejection fraction (EF 30 to 35%), coronary artery disease status post CABG, sinus pauses status post pacemaker, hypertension, history of CVAs, type 2 diabetes who presented to [facility] on 01/08/2020 for syncopal event that occurred on the day of admission.  He was admitted to [facility] for a syncopal work-up.  Patient notes that prior to the event, he had been having diarrhea over the past couple of days.  He was working on a car with his son when he began to feel unwell and began feeling weak with diaphoresis.  He was sat down on a chair and EMS was called.  In route to the hospital, he received IV fluids at which time he notes he began feeling significantly better.  Initial concern was for stroke however CT head on admission did not reveal any acute findings.  After further evaluation, patient did not have any focal deficits from baseline and it was felt that a stroke was unlikely.  He also had an AKI on admission which further supported dehydration as a cause for the syncopal event.  Patient noted 3 other similar events over the past year precipitated by diarrhea and subsequent dehydration.  AKI improved with IV fluids.  Orthostatic vitals were normal the day following admission  Pacemaker interrogation was unrevealing--no dysrhythmias noted in time surrounding event.  Etiology of his diarrhea remains unclear.  According to the patient, it sounds like this is been an ongoing issue intermittently for at least several months if not  a couple years.  He is on Metformin which does have a known side effect of diarrhea however the timeline does not necessarily correlate.  He also has been having some GI bleeding due to a Mallory-Weiss tear and then Barrett's esophagus and I am considering possibly the bleeding causing hypermotility resulting in diarrhea.  At time of discharge, Metformin was held so that he can be reevaluated by his PCP to see if his symptoms improve with that. Given the chronicity of the symptoms, I would consider it fairly unlikely that this is an infectious etiology. He is established with GI at [facility] and per their report, he has had a fairly recent colonoscopy that did not show any findings to correlate with his diarrhea.  Could consider microscopic colitis however.  Additionally, given his fairly well-known atherosclerotic disease, we could consider chronic ischemic colitis.  Discharge Vitals:   BP (!) 158/73 (BP Location: Right Arm)   Pulse 64   Temp 98.3 F (36.8 C) (Oral)  Resp 18   Ht 5\' 8"  (1.727 m)   Wt 85.4 kg   SpO2 98%   BMI 28.63 kg/m   Pertinent Labs, Studies, and Procedures:  5/8 CT head: No evidence of acute abnormality.  Redemonstrated chronic lacunar infarcts within the right basal ganglia and left cerebellum.  5/8 chest x-ray: No acute process.  CBC Latest Ref Rng & Units 01/09/2020 01/08/2020 01/08/2020  WBC 4.0 - 10.5 K/uL 4.3 - 6.3  Hemoglobin 13.0 - 17.0 g/dL 10.1(L) 10.5(L) 10.4(L)  Hematocrit 39.0 - 52.0 % 32.3(L) 31.0(L) 34.7(L)  Platelets 150 - 400 K/uL 143(L) - 163   BMP Latest Ref Rng & Units 01/10/2020 01/09/2020 01/08/2020  Glucose 70 - 99 mg/dL 03/09/2020) 841(L) 244(W)  BUN 8 - 23 mg/dL 17 18 102(V)  Creatinine 0.61 - 1.24 mg/dL 25(D) 6.64(Q) 0.34(V)  Sodium 135 - 145 mmol/L 137 137 137  Potassium 3.5 - 5.1 mmol/L 4.5 4.0 4.8  Chloride 98 - 111 mmol/L 103 106 107  CO2 22 - 32 mmol/L 24 23 -  Calcium 8.9 - 10.3 mg/dL 9.2 9.1 -      Signed: 4.25(Z,  MD 01/11/2020, 1:06 PM   Pager: [phone]

## 2020-01-09 NOTE — Plan of Care (Signed)
  Problem: Activity: Goal: Risk for activity intolerance will decrease Outcome: Progressing   Problem: Nutrition: Goal: Adequate nutrition will be maintained Outcome: Progressing   Problem: Safety: Goal: Ability to remain free from injury will improve Outcome: Progressing   Problem: Skin Integrity: Goal: Risk for impaired skin integrity will decrease Outcome: Progressing   

## 2020-01-09 NOTE — Care Management Obs Status (Signed)
MEDICARE OBSERVATION STATUS NOTIFICATION   Patient Details  Name: Samuel Houston MRN: [id number] Date of Birth: [birth year]   Medicare Observation Status Notification Given:  Yes    Lawerance Sabal, RN 01/09/2020, 4:35 PM

## 2020-01-10 DIAGNOSIS — R55 Syncope and collapse: Secondary | ICD-10-CM | POA: Diagnosis not present

## 2020-01-10 LAB — BASIC METABOLIC PANEL
Anion gap: 10 (ref 5–15)
BUN: 17 mg/dL (ref 8–23)
CO2: 24 mmol/L (ref 22–32)
Calcium: 9.2 mg/dL (ref 8.9–10.3)
Chloride: 103 mmol/L (ref 98–111)
Creatinine, Ser: 1.34 mg/dL — ABNORMAL HIGH (ref 0.61–1.24)
GFR calc Af Amer: 60 mL/min — ABNORMAL LOW (ref >60)
GFR calc non Af Amer: 51 mL/min — ABNORMAL LOW (ref >60)
Glucose, Bld: 249 mg/dL — ABNORMAL HIGH (ref 70–99)
Potassium: 4.5 mmol/L (ref 3.5–5.1)
Sodium: 137 mmol/L (ref 135–145)

## 2020-01-10 LAB — GLUCOSE, CAPILLARY: Glucose-Capillary: 195 mg/dL — ABNORMAL HIGH (ref 70–99)

## 2020-01-10 NOTE — Plan of Care (Signed)
  Problem: Education: Goal: Knowledge of General Education information will improve Description: Including pain rating scale, medication(s)/side effects and non-pharmacologic comfort measures Outcome: Adequate for Discharge   Problem: Health Behavior/Discharge Planning: Goal: Ability to manage health-related needs will improve Outcome: Adequate for Discharge   Problem: Clinical Measurements: Goal: Ability to maintain clinical measurements within normal limits will improve Outcome: Adequate for Discharge Goal: Will remain free from infection Outcome: Adequate for Discharge Goal: Diagnostic test results will improve Outcome: Adequate for Discharge Goal: Cardiovascular complication will be avoided Outcome: Adequate for Discharge   Problem: Activity: Goal: Risk for activity intolerance will decrease Outcome: Adequate for Discharge   Problem: Nutrition: Goal: Adequate nutrition will be maintained Outcome: Adequate for Discharge   Problem: Elimination: Goal: Will not experience complications related to bowel motility Outcome: Adequate for Discharge Goal: Will not experience complications related to urinary retention Outcome: Adequate for Discharge   Problem: Pain Managment: Goal: General experience of comfort will improve Outcome: Adequate for Discharge   Problem: Safety: Goal: Ability to remain free from injury will improve Outcome: Adequate for Discharge   Problem: Skin Integrity: Goal: Risk for impaired skin integrity will decrease Outcome: Adequate for Discharge   

## 2020-01-10 NOTE — Progress Notes (Signed)
  PPM interrogation personally reviewed.  No tachyarrhythmias that would explain his presentation.   Last episode of AF was 07/12/2019 (2 seconds).   Please call if any further questions. He is overdue for EP follow up in person.  Will schedule.   Casimiro Needle [address]" [place], PA-C  01/10/2020 8:24 AM

## 2020-01-10 NOTE — Progress Notes (Signed)
Pt CBG 223. Pt indicated he did not want insulin coverage at this time.On call notified. No new orders.

## 2020-01-10 NOTE — Progress Notes (Addendum)
NAME:  Samuel Houston, MRN:  [id number], DOB:  Jun 24, 1946, LOS: 0 ADMISSION DATE:  01/08/2020   Brief History  74 yo male with HFrEF (EF 30-35%), CAD s/p CABG, pacemaker 2/2 sinus pauses, HTN, history of CVAs, T2DM who presented to [facility] on 01/08/20 for a syncopal event that occurred on day of admission. Admitted to IMTS for syncopal workup.  Patient has fairly significant cardiac history including:  -CABG in 2012.  Left heart cath in April 2021 revealed multivessel coronary artery disease with occluded SVG to RCA for which medical therapy was advised.  They considered PCI however patient's not a DAPT candidate due to his GI bleed. -Ischemic dilated cardiomyopathy.  Ejection fraction has decreased fairly significantly over the last couple of years with most recent EF being 30 to 35% -He also had a pacemaker placed couple years ago due to sinus pauses  On chart review confirms recent ED visits for similar events that are believed to be secondary to orthostatic hypotension 2/2 diarrhea.   GI bleed from a mallory weiss tear that was subsequently cauterized and clipped on 07/13/19.Marland Kitchen Evaluated at that time by GI who documents recent colonoscopy in the past 4 years that was normal aside from moderate hemorrhoids. Subjective  No overnight events. Discussed how the syncopal event was likely 2/2 dehydration 2/2 diarrhea. He notes that the diarrhea has been an intermittent ongoing issue for several months but had been particularly bad this last week. Significant Hospital Events   5/8>admission for syncope 5/9>discussed pacemaker interrogation with cards 5/10>  Objective   Blood pressure (!) 157/74, pulse 65, temperature 98.4 F (36.9 C), temperature source Oral, resp. rate 14, height 5\' 8"  (1.727 m), weight 85.4 kg, SpO2 95 %.     Intake/Output Summary (Last 24 hours) at 01/10/2020 0504 Last data filed at 01/09/2020 1754 Gross per 24 hour  Intake 975 ml  Output 2080 ml  Net -1105 ml   Filed  Weights   01/08/20 1459 01/09/20 0500  Weight: 87.5 kg 85.4 kg    Examination: GENERAL: in no acute distress CARDIAC: heart RRR. No peripheral edema.  PULMONARY: Lung sounds clear to auscultation. NEURO: a/o. CNII-XII intact. 5/5 strength equal in bilateral upper and lower extremities. GI: bs active. No tenderness.  Consults:  Neurology Cardiology for pacemaker interrogation  Significant Diagnostic Tests:  5/8 CT head>no acute abnormalities. Chronic lacunar infarcts within right basal ganglia and left cerebellum. Mild generalized parenchymal atrophy and chronic small vessel ischemic disease  5/8 CXR> no acute process  Micro Data:  n/a  Antimicrobials:  n/a  Labs    CBC Latest Ref Rng & Units 01/09/2020 01/08/2020 01/08/2020  WBC 4.0 - 10.5 K/uL 4.3 - 6.3  Hemoglobin 13.0 - 17.0 g/dL 10.1(L) 10.5(L) 10.4(L)  Hematocrit 39.0 - 52.0 % 32.3(L) 31.0(L) 34.7(L)  Platelets 150 - 400 K/uL 143(L) - 163   BMP Latest Ref Rng & Units 01/09/2020 01/08/2020 01/08/2020  Glucose 70 - 99 mg/dL 03/09/2020) 500(B) 704(U)  BUN 8 - 23 mg/dL 18 889(V) 21  Creatinine 0.61 - 1.24 mg/dL 69(I) 5.03(U) 8.82(C)  Sodium 135 - 145 mmol/L 137 137 137  Potassium 3.5 - 5.1 mmol/L 4.0 4.8 4.8  Chloride 98 - 111 mmol/L 106 107 105  CO2 22 - 32 mmol/L 23 - 18(L)  Calcium 8.9 - 10.3 mg/dL 9.1 - 9.1    Summary  74 yo male with significant cardiac history including HFrEF (EF 30-35%), CAD s/p CABG, sinus pauses s/p pacemaker, HTN, CVAs, and T2DM  admitted to IMTS for syncopal workup.  Assessment & Plan:  Active Problems:   Syncope   Diarrhea  Syncope. Likely 2/2 volume depletion in setting of diarrhea -No acute findings on head CT -Awaiting pacemaker interrogation--discussed with cards -orthostatic vitals normal on day after admission -pacemaker interrogated last night. No tachyarrhythmias. Last episode of AF 07/12/2019. -no further episodes during hospitalization Plan Stable for discharge.  Diarrhea. Broad ddx  including medication adverse effect (metformin) vs bleed from barrett's.  Barrett's esopahagus with high grade dysplasia. Chart review indicates plans to undergo radioablative treatment with Dr. Newman Pies (GI) at [place]: will continue to hold metformin at discharge and have him follow up with his PCP. During that time, they will be able to evaluate if he has had ongoing diarrhea to help delineate.  AKI resolved. Back at baseline of 1.3 today.  HFrEF (30-35%). Follows with cardiology at [place]. Not appearing fluid overloaded at this time.  Sinus pause s/p pacemaker HTN Plan: continue home medications. Asprin, fenofibrate, metoprolol, statin. Continue to hold ACEi. Needs EP follow up at discharge.  T2DM. On metformin and glipizide at home. Held on admission for AKI. Continue SSI. Adjust as necessary. Will have him continue to hold metformin at discharge and follow up with PCP.  Chronic anemia/thrombocytopenia. Chart review indicates that current labs are at his baseline however unsure why he has thrombocytopenia. Anemia believed to be ACD vs bleed from GI issues. Would recommend outpatient follow up for thrombocytopenia/anemia.  Best practice:  CODE STATUS: FULL Diet: cardiac DVT for prophylaxis: lovenox Dispo:  stable for discharge today    Mitzi Hansen, MD INTERNAL MEDICINE RESIDENT PGY-1 PAGER #: [phone] 01/10/20  5:04 AM

## 2020-01-10 NOTE — Progress Notes (Signed)
Patient educated on discharge paperwork.  Educated on what and when to take medications and on follow up appointments.  Pateint assisted to get dressed and all belongings packed and taken with patient at this time.  No s/s of distress noted.  Denies any pain.

## 2020-02-02 ENCOUNTER — Encounter: Payer: Medicare HMO | Admitting: Student

## 2020-02-24 ENCOUNTER — Ambulatory Visit (INDEPENDENT_AMBULATORY_CARE_PROVIDER_SITE_OTHER): Payer: Medicare HMO | Admitting: *Deleted

## 2020-02-24 DIAGNOSIS — I495 Sick sinus syndrome: Secondary | ICD-10-CM | POA: Diagnosis not present

## 2020-02-24 LAB — CUP PACEART REMOTE DEVICE CHECK
Battery Remaining Longevity: 131 mo
Battery Remaining Percentage: 95.5 %
Battery Voltage: 3.01 V
Brady Statistic AP VP Percent: 1 %
Brady Statistic AP VS Percent: 7.3 %
Brady Statistic AS VP Percent: 1 %
Brady Statistic AS VS Percent: 92 %
Brady Statistic RA Percent Paced: 6.5 %
Brady Statistic RV Percent Paced: 1 %
Date Time Interrogation Session: 20210624020030
Implantable Lead Implant Date: 20170825
Implantable Lead Implant Date: 20170825
Implantable Lead Location: 753859
Implantable Lead Location: 753860
Implantable Pulse Generator Implant Date: 20170825
Lead Channel Impedance Value: 440 Ohm
Lead Channel Impedance Value: 480 Ohm
Lead Channel Pacing Threshold Amplitude: 0.625 V
Lead Channel Pacing Threshold Amplitude: 0.875 V
Lead Channel Pacing Threshold Pulse Width: 0.5 ms
Lead Channel Pacing Threshold Pulse Width: 0.5 ms
Lead Channel Sensing Intrinsic Amplitude: 5 mV
Lead Channel Sensing Intrinsic Amplitude: 9.9 mV
Lead Channel Setting Pacing Amplitude: 0.875
Lead Channel Setting Pacing Amplitude: 1.875
Lead Channel Setting Pacing Pulse Width: 0.5 ms
Lead Channel Setting Sensing Sensitivity: 2 mV
Pulse Gen Model: 2272
Pulse Gen Serial Number: 7941497

## 2020-02-28 NOTE — Progress Notes (Signed)
Remote pacemaker transmission.   

## 2020-05-25 ENCOUNTER — Ambulatory Visit (INDEPENDENT_AMBULATORY_CARE_PROVIDER_SITE_OTHER): Payer: Medicare HMO | Admitting: Emergency Medicine

## 2020-05-25 DIAGNOSIS — I495 Sick sinus syndrome: Secondary | ICD-10-CM | POA: Diagnosis not present

## 2020-05-26 LAB — CUP PACEART REMOTE DEVICE CHECK
Battery Remaining Longevity: 131 mo
Battery Remaining Percentage: 95.5 %
Battery Voltage: 2.99 V
Brady Statistic AP VP Percent: 1 %
Brady Statistic AP VS Percent: 9.2 %
Brady Statistic AS VP Percent: 1 %
Brady Statistic AS VS Percent: 90 %
Brady Statistic RA Percent Paced: 8.4 %
Brady Statistic RV Percent Paced: 1 %
Date Time Interrogation Session: 20210924000147
Implantable Lead Implant Date: 20170825
Implantable Lead Implant Date: 20170825
Implantable Lead Location: 753859
Implantable Lead Location: 753860
Implantable Pulse Generator Implant Date: 20170825
Lead Channel Impedance Value: 440 Ohm
Lead Channel Impedance Value: 480 Ohm
Lead Channel Pacing Threshold Amplitude: 0.875 V
Lead Channel Pacing Threshold Amplitude: 1.5 V
Lead Channel Pacing Threshold Pulse Width: 0.5 ms
Lead Channel Pacing Threshold Pulse Width: 0.5 ms
Lead Channel Sensing Intrinsic Amplitude: 3.2 mV
Lead Channel Sensing Intrinsic Amplitude: 9.5 mV
Lead Channel Setting Pacing Amplitude: 1.75 V
Lead Channel Setting Pacing Amplitude: 1.875
Lead Channel Setting Pacing Pulse Width: 0.5 ms
Lead Channel Setting Sensing Sensitivity: 2 mV
Pulse Gen Model: 2272
Pulse Gen Serial Number: 7941497

## 2020-05-30 NOTE — Progress Notes (Signed)
Remote pacemaker transmission.   

## 2020-08-24 ENCOUNTER — Ambulatory Visit (INDEPENDENT_AMBULATORY_CARE_PROVIDER_SITE_OTHER): Payer: Medicare HMO

## 2020-08-24 DIAGNOSIS — I495 Sick sinus syndrome: Secondary | ICD-10-CM

## 2020-08-24 LAB — CUP PACEART REMOTE DEVICE CHECK
Battery Remaining Longevity: 134 mo
Battery Remaining Percentage: 95.5 %
Battery Voltage: 3.01 V
Brady Statistic AP VP Percent: 1 %
Brady Statistic AP VS Percent: 11 %
Brady Statistic AS VP Percent: 1 %
Brady Statistic AS VS Percent: 88 %
Brady Statistic RA Percent Paced: 10 %
Brady Statistic RV Percent Paced: 1 %
Date Time Interrogation Session: 20211223020023
Implantable Lead Implant Date: 20170825
Implantable Lead Implant Date: 20170825
Implantable Lead Location: 753859
Implantable Lead Location: 753860
Implantable Pulse Generator Implant Date: 20170825
Lead Channel Impedance Value: 460 Ohm
Lead Channel Impedance Value: 490 Ohm
Lead Channel Pacing Threshold Amplitude: 0.5 V
Lead Channel Pacing Threshold Amplitude: 0.875 V
Lead Channel Pacing Threshold Pulse Width: 0.5 ms
Lead Channel Pacing Threshold Pulse Width: 0.5 ms
Lead Channel Sensing Intrinsic Amplitude: 11.2 mV
Lead Channel Sensing Intrinsic Amplitude: 4.2 mV
Lead Channel Setting Pacing Amplitude: 0.75 V
Lead Channel Setting Pacing Amplitude: 1.875
Lead Channel Setting Pacing Pulse Width: 0.5 ms
Lead Channel Setting Sensing Sensitivity: 2 mV
Pulse Gen Model: 2272
Pulse Gen Serial Number: 7941497

## 2020-09-06 NOTE — Progress Notes (Signed)
Remote pacemaker transmission.   

## 2020-11-23 ENCOUNTER — Ambulatory Visit (INDEPENDENT_AMBULATORY_CARE_PROVIDER_SITE_OTHER): Payer: Medicare HMO

## 2020-11-23 DIAGNOSIS — I495 Sick sinus syndrome: Secondary | ICD-10-CM

## 2020-11-26 LAB — CUP PACEART REMOTE DEVICE CHECK
Battery Remaining Longevity: 133 mo
Battery Remaining Percentage: 95.5 %
Battery Voltage: 2.99 V
Brady Statistic AP VP Percent: 1 %
Brady Statistic AP VS Percent: 13 %
Brady Statistic AS VP Percent: 1 %
Brady Statistic AS VS Percent: 86 %
Brady Statistic RA Percent Paced: 12 %
Brady Statistic RV Percent Paced: 1 %
Date Time Interrogation Session: 20220326112227
Implantable Lead Implant Date: 20170825
Implantable Lead Implant Date: 20170825
Implantable Lead Location: 753859
Implantable Lead Location: 753860
Implantable Pulse Generator Implant Date: 20170825
Lead Channel Impedance Value: 460 Ohm
Lead Channel Impedance Value: 490 Ohm
Lead Channel Pacing Threshold Amplitude: 0.625 V
Lead Channel Pacing Threshold Amplitude: 0.875 V
Lead Channel Pacing Threshold Pulse Width: 0.5 ms
Lead Channel Pacing Threshold Pulse Width: 0.5 ms
Lead Channel Sensing Intrinsic Amplitude: 11 mV
Lead Channel Sensing Intrinsic Amplitude: 4.7 mV
Lead Channel Setting Pacing Amplitude: 0.875
Lead Channel Setting Pacing Amplitude: 1.875
Lead Channel Setting Pacing Pulse Width: 0.5 ms
Lead Channel Setting Sensing Sensitivity: 2 mV
Pulse Gen Model: 2272
Pulse Gen Serial Number: 7941497

## 2020-11-27 ENCOUNTER — Telehealth: Payer: Self-pay

## 2020-11-27 NOTE — Telephone Encounter (Signed)
Call from [place] PA at [facility]. Huntley Dec reported patient is inpatient and needs MRI. She reports patient has been an inpatient for 2 days. Informed that ST Jude pacemaker is MRI compatible and industry will have to give recommendations for programming during MRI. Form faxed is for [employer] device MRI form faxed form back with serial # of [employer] Jude device with recommendation of industry check prior. Confirmation received that fax was received.

## 2020-11-27 NOTE — Telephone Encounter (Signed)
I let Arline Asp, rn speak with [facility] hospital.

## 2020-12-05 NOTE — Progress Notes (Signed)
Remote pacemaker transmission.   

## 2020-12-07 ENCOUNTER — Encounter: Payer: Self-pay | Admitting: Physician Assistant

## 2020-12-07 ENCOUNTER — Other Ambulatory Visit: Payer: Self-pay

## 2020-12-07 ENCOUNTER — Ambulatory Visit: Payer: Medicare HMO | Admitting: Physician Assistant

## 2020-12-07 VITALS — BP 122/78 | HR 72 | Ht 68.0 in | Wt 177.0 lb

## 2020-12-07 DIAGNOSIS — R55 Syncope and collapse: Secondary | ICD-10-CM

## 2020-12-07 DIAGNOSIS — I10 Essential (primary) hypertension: Secondary | ICD-10-CM | POA: Diagnosis not present

## 2020-12-07 DIAGNOSIS — Z950 Presence of cardiac pacemaker: Secondary | ICD-10-CM

## 2020-12-07 DIAGNOSIS — I2510 Atherosclerotic heart disease of native coronary artery without angina pectoris: Secondary | ICD-10-CM

## 2020-12-07 DIAGNOSIS — I255 Ischemic cardiomyopathy: Secondary | ICD-10-CM

## 2020-12-07 DIAGNOSIS — I455 Other specified heart block: Secondary | ICD-10-CM | POA: Diagnosis not present

## 2020-12-07 LAB — CUP PACEART INCLINIC DEVICE CHECK
Battery Remaining Longevity: 141 mo
Battery Voltage: 2.99 V
Brady Statistic RA Percent Paced: 12 %
Brady Statistic RV Percent Paced: 0.16 %
Date Time Interrogation Session: 20220407161523
Implantable Lead Implant Date: 20170825
Implantable Lead Implant Date: 20170825
Implantable Lead Location: 753859
Implantable Lead Location: 753860
Implantable Pulse Generator Implant Date: 20170825
Lead Channel Impedance Value: 462.5 Ohm
Lead Channel Impedance Value: 487.5 Ohm
Lead Channel Pacing Threshold Amplitude: 0.625 V
Lead Channel Pacing Threshold Amplitude: 0.75 V
Lead Channel Pacing Threshold Pulse Width: 0.5 ms
Lead Channel Pacing Threshold Pulse Width: 0.5 ms
Lead Channel Sensing Intrinsic Amplitude: 10.9 mV
Lead Channel Sensing Intrinsic Amplitude: 5 mV
Lead Channel Setting Pacing Amplitude: 0.875
Lead Channel Setting Pacing Amplitude: 1.75 V
Lead Channel Setting Pacing Pulse Width: 0.5 ms
Lead Channel Setting Sensing Sensitivity: 2 mV
Pulse Gen Model: 2272
Pulse Gen Serial Number: 7941497

## 2020-12-07 NOTE — Patient Instructions (Signed)

## 2020-12-07 NOTE — Progress Notes (Addendum)
Cardiology Office Note Date:  12/07/2020  Patient ID:  Samuel Houston, DOB 13-Feb-1946, MRN [id number] PCP:  [name], MD  Cardiologist:  [name] Electrophysiologist: [name] (last May 2019)    Chief Complaint: over due pacer check  History of Present Illness: Samuel Houston is a 75 y.o. male with history of HTN, HLD (statin intolerant), CAD (CABG), sinus node dysfunction w/PPM, stroke, DM, barrett's esophagus w/GIB Hx, CKD (III), mixed ischemic/DCM  He comes in today to be seen for [name], last seen by him may 2019.   Admitted to [facility] 11/21/20 w/R arm weakness, slurred speech, suffered a syncopal event on arrival with reports of HR 60., other note reports 50's, though SBP 90's Found with some degree R ICA stenosis Seem hypotension in setting of some vascular disease felt the clplrit, meds adjusted Discharge mentions wanted to get MRI though unable to get clearance for our office device-wise since he had not been in years and reecommend that he follow up with up to get clearance for MRI and follow up with PMD  Saw cardiology team at [facility] discussed LHC due to positive NMST on 12/17/2019 showing multivessel CADwith patent LIMA to LAD, patent SVG to diagonal, CTO to OM, occluded SVG to RCA, moderate segmental LV dysfunction, inferior akinesis with medical therapy advised. There will be consideration of elective PCIof the LMCA to enhance flow into the diagonal and septal/RCA collaterals once he is cleared from a GI standpoint from his Barrett's esophagus/adenocarcinoma evaluation/treatment as he is currently not a DAPT candidate (GI bleed requiring transfusion and multiple radiofrequency ablations that could not be performed on DAPT. Seems in d/w GI team, was cleared for LHC and DAPT, planned to update his echo and see dr. [name] to revisit LHC 2/2 progressive weakness, fatigue. Referred to nephrology   TODAY He comes accompanied by his wife He denies any  concerns Denies to me CP, palpitations or cardiac awareness, no new SOB, DOE. No dizzy spells, near syncope or syncope  He knows his device is MRI compatible says he has had MRIs before, not sure why they would not do it.  No new neuro symptoms   Device information SJM dual chamber PPM implanted 04/26/2016   Past Medical History:  Diagnosis Date  . CAD (coronary artery disease)    s/p CABG  . Diabetes mellitus   . Hypertension   . 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 A-V CARDIAC PACEMAKER INSERTION Left 04/26/2016   [facility] [employer] Medical Assurity MRI  model [id number] (serial number  [id number] )  . CARDIAC CATHETERIZATION N/A 04/26/2016   Procedure: Left Heart Cath and Cors/Grafts Angiography;  Surgeon: [name], MD;  Location: [facility] INVASIVE CV LAB;  Service: Cardiovascular;  Laterality: N/A;  . CORONARY ARTERY BYPASS GRAFT  08-07-11  . EP IMPLANTABLE DEVICE N/A 04/26/2016   Procedure: Pacemaker Implant;  Surgeon: [name], MD;  Location: [facility] INVASIVE CV LAB;  Service: Cardiovascular;  Laterality: N/A;    Current Outpatient Medications  Medication Sig Dispense Refill  . aspirin 325 MG tablet Take 1 tablet by mouth daily.    Marland Kitchen ezetimibe (ZETIA) 10 MG tablet Take 10 mg by mouth daily.    . fenofibrate 160 MG tablet Take 160 mg by mouth daily.    . ferrous sulfate 325 (65 FE) MG EC tablet Take  325 mg by mouth 2 (two) times daily with a meal.     . finasteride (PROSCAR) 5 MG tablet Take 5 mg by mouth daily.    . folic acid (FOLVITE) 1 MG tablet Take 1 mg by mouth daily.    Marland Kitchen glipiZIDE (GLUCOTROL XL) 10 MG 24 hr tablet Take 1 tablet by mouth daily.    Marland Kitchen lisinopril (ZESTRIL) 10 MG tablet Take 10 mg by mouth in the morning and at bedtime.    . metoprolol tartrate (LOPRESSOR) 25 MG tablet Take 25 mg by mouth 2 (two) times daily.    . Multiple  Vitamins-Minerals (ZINC PO) Take 1 tablet by mouth daily.    . Omega-3 Fatty Acids (FISH OIL PO) Take 1 capsule by mouth 2 (two) times daily.    . rosuvastatin (CRESTOR) 20 MG tablet Take 10 mg by mouth daily.    . sitaGLIPtin (JANUVIA) 100 MG tablet Take 100 mg by mouth daily.    . tamsulosin (FLOMAX) 0.4 MG CAPS capsule Take 0.4 mg by mouth daily.    . vitamin B-12 (CYANOCOBALAMIN) 1000 MCG tablet Take 1,000 mcg by mouth daily.     No current facility-administered medications for this visit.    Allergies:   Ascorbate and Penicillins   Social History:  The patient  reports that he quit smoking about 16 years ago. His smoking use included cigarettes. He smoked 3.00 packs per day. He has never used smokeless tobacco. He reports that he does not drink alcohol and does not use drugs.   Family History:  The patient's family history includes Diabetes in his maternal grandmother and mother; Heart attack in his father and paternal grandfather; Heart disease in his father and paternal grandfather; Hypertension in his mother.  ROS:  Please see the history of present illness.    All other systems are reviewed and otherwise negative.   PHYSICAL EXAM:  VS:  BP 122/78   Pulse 72   Ht 5\' 8"  (1.727 m)   Wt 177 lb (80.3 kg)   SpO2 97%   BMI 26.91 kg/m  BMI: Body mass index is 26.91 kg/m. Well nourished, well developed, in no acute distress HEENT: normocephalic, atraumatic Neck: no JVD, carotid bruits or masses Cardiac:  RRR; no significant murmurs, no rubs, or gallops Lungs:  CTA b/l, no wheezing, rhonchi or rales Abd: soft, nontender MS: no deformity or atrophy Ext: no edema Skin: warm and dry, no rash Neuro:  No gross deficits appreciated Psych: euthymic mood, full affect  PPM site is stable, no tethering or discomfort   EKG:  Not done today His wife reports he just had an EKG a month or so ago  Device interrogation done today and reviewed by myself:  Battery and lead measurements  are stable AMS episodes noted ALL available EGMs are reviewed, one is brief  A lead noise The others are short 1:1 tachycardias, longest 12 seconds No AFib/flutter  Unable to reproduce A lead noise today, stable measurements on the lead AP 12%  11/14/20: TTE PROCEDURE A two-dimensional transthoracic echocardiogram with color flow and Doppler was performed. Image Quality: Technically difficult. - SUMMARY There is basal LV inferior wall hypokinesis LV ejection fraction = 45-50%. The left atrium is moderately dilated. There is mild mitral regurgitation. There is no pericardial effusion. Image Quality: Technically difficult. Probably no significant change in comparison with the prior study Noted   LHC due to positive NMST on 12/17/2019  showing multivessel CADwith patent LIMA to LAD, patent SVG  to diagonal, CTO to OM, occluded SVG to RCA, moderate segmental LV dysfunction, inferior akinesis with medical therapy advised   TTE 04/27/16  Review of the above records today demonstrates:  - Left ventricle: The cavity size was normal. There was severe concentric hypertrophy. Systolic function was normal. The estimated ejection fraction was in the range of 60% to 65%. Wall motion was normal; there were no regional wall motion abnormalities. Doppler parameters are consistent with abnormal left ventricular relaxation (grade 1 diastolic dysfunction). There was no evidence of elevated ventricular filling pressure by Doppler parameters. - Aortic valve: Trileaflet; mildly thickened, mildly calcified leaflets. - Aortic root: The aortic root was normal in size. - Mitral valve: Calcified annulus. Mildly thickened leaflets . There was mild regurgitation. - Left atrium: The atrium was normal in size. - Right ventricle: Systolic function was normal. - Right atrium: The atrium was normal in size. - Tricuspid valve: There was mild regurgitation. - Pulmonary arteries: Systolic  pressure was within the normal range. - Inferior vena cava: The vessel was normal in size. - Pericardium, extracardiac: There was no pericardial effusion.  Cardiac Cath 04/26/16  Severe native three vessel CAD.  Mid LAD lesion, 100 %stenosed. Patent LIMA to LAD.  100% diagonal occlusion. Patent SVG to diagonal.  Prox Cx to Mid Cx lesion, 100 %stenosed. Patent SVG to OM.  Prox RCA to Mid RCA lesion, 100 %stenosed. Patent SVG to PDA.  The left ventricular systolic function is normal.  LV end diastolic pressure is normal.  The left ventricular ejection fraction is 55-65% by visual estimate.  There is no aortic valve stenosis.    Recent Labs: 01/08/2020: ALT 68 01/09/2020: Hemoglobin 10.1; Platelets 143 01/10/2020: BUN 17; Creatinine, Ser 1.34; Potassium 4.5; Sodium 137  No results found for requested labs within last 8760 hours.   CrCl cannot be calculated (Patient's most recent lab result is older than the maximum 21 days allowed.).   Wt Readings from Last 3 Encounters:  12/07/20 177 lb (80.3 kg)  01/09/20 188 lb 4.4 oz (85.4 kg)  12/31/17 185 lb (83.9 kg)     Other studies reviewed: Additional studies/records reviewed today include: summarized above  ASSESSMENT AND PLAN:  1. PPM     Intact function     His system is MRI compatible  discussed annual office visits We ar getting his remotes  2. CAD     Denies any CP or new/changing symptoms     follows with [facility] team  3. HTN     >>> relative hypotension with recurrent TIA symptoms     BP looks good today     Deferred to his primary cardiology.enurology teams  4. Ischemic/DCM     Chronic CHF     No symptoms or exam findings of volume OL     Disposition: F/u with remotes as usual and in clinic in a year, sooner if needed  Current medicines are reviewed at length with the patient today.  The patient did not have any concerns regarding medicines.  Norma Fredrickson, PA-C 12/07/2020 4:13 PM     [facility] [address] [place] [address] [phone] (office)  [phone] (fax)

## 2020-12-08 ENCOUNTER — Telehealth: Payer: Self-pay | Admitting: Physician Assistant

## 2020-12-08 NOTE — Telephone Encounter (Signed)
New message:      Samuel Houston is calling wanting to know if this pt is being referred to the Clinic. If so, they will need the other pages of the fax that was sent. They only received 2 pages out of the 8 pages that were sent. They will also need the demographics.. Please fax to [phone] .

## 2020-12-13 NOTE — Telephone Encounter (Signed)
I am not sure as well what this is pertaining too. Just saw that you had recently seen the pt and was not sure if it was something that you had discussed with the pt.

## 2021-03-01 ENCOUNTER — Ambulatory Visit (INDEPENDENT_AMBULATORY_CARE_PROVIDER_SITE_OTHER): Payer: Medicare HMO

## 2021-03-01 DIAGNOSIS — I255 Ischemic cardiomyopathy: Secondary | ICD-10-CM

## 2021-03-05 LAB — CUP PACEART REMOTE DEVICE CHECK
Battery Remaining Longevity: 76 mo
Battery Remaining Percentage: 58 %
Battery Voltage: 2.99 V
Brady Statistic AP VP Percent: 1 %
Brady Statistic AP VS Percent: 12 %
Brady Statistic AS VP Percent: 1 %
Brady Statistic AS VS Percent: 88 %
Brady Statistic RA Percent Paced: 12 %
Brady Statistic RV Percent Paced: 1 %
Date Time Interrogation Session: 20220630081138
Implantable Lead Implant Date: 20170825
Implantable Lead Implant Date: 20170825
Implantable Lead Location: 753859
Implantable Lead Location: 753860
Implantable Pulse Generator Implant Date: 20170825
Lead Channel Impedance Value: 460 Ohm
Lead Channel Impedance Value: 460 Ohm
Lead Channel Pacing Threshold Amplitude: 0.625 V
Lead Channel Pacing Threshold Amplitude: 0.875 V
Lead Channel Pacing Threshold Pulse Width: 0.5 ms
Lead Channel Pacing Threshold Pulse Width: 0.5 ms
Lead Channel Sensing Intrinsic Amplitude: 10.5 mV
Lead Channel Sensing Intrinsic Amplitude: 4.1 mV
Lead Channel Setting Pacing Amplitude: 0.875
Lead Channel Setting Pacing Amplitude: 1.875
Lead Channel Setting Pacing Pulse Width: 0.5 ms
Lead Channel Setting Sensing Sensitivity: 2 mV
Pulse Gen Model: 2272
Pulse Gen Serial Number: 7941497

## 2021-03-21 NOTE — Progress Notes (Signed)
Remote pacemaker transmission.   

## 2021-05-31 ENCOUNTER — Ambulatory Visit (INDEPENDENT_AMBULATORY_CARE_PROVIDER_SITE_OTHER): Payer: Medicare HMO

## 2021-05-31 DIAGNOSIS — I255 Ischemic cardiomyopathy: Secondary | ICD-10-CM | POA: Diagnosis not present

## 2021-06-01 LAB — CUP PACEART REMOTE DEVICE CHECK
Battery Remaining Longevity: 73 mo
Battery Remaining Percentage: 56 %
Battery Voltage: 2.98 V
Brady Statistic AP VP Percent: 1 %
Brady Statistic AP VS Percent: 9.4 %
Brady Statistic AS VP Percent: 1 %
Brady Statistic AS VS Percent: 90 %
Brady Statistic RA Percent Paced: 8.9 %
Brady Statistic RV Percent Paced: 1 %
Date Time Interrogation Session: 20220930013019
Implantable Lead Implant Date: 20170825
Implantable Lead Implant Date: 20170825
Implantable Lead Location: 753859
Implantable Lead Location: 753860
Implantable Pulse Generator Implant Date: 20170825
Lead Channel Impedance Value: 480 Ohm
Lead Channel Impedance Value: 490 Ohm
Lead Channel Pacing Threshold Amplitude: 0.5 V
Lead Channel Pacing Threshold Amplitude: 0.875 V
Lead Channel Pacing Threshold Pulse Width: 0.5 ms
Lead Channel Pacing Threshold Pulse Width: 0.5 ms
Lead Channel Sensing Intrinsic Amplitude: 10.5 mV
Lead Channel Sensing Intrinsic Amplitude: 5 mV
Lead Channel Setting Pacing Amplitude: 0.75 V
Lead Channel Setting Pacing Amplitude: 1.875
Lead Channel Setting Pacing Pulse Width: 0.5 ms
Lead Channel Setting Sensing Sensitivity: 2 mV
Pulse Gen Model: 2272
Pulse Gen Serial Number: 7941497

## 2021-06-11 NOTE — Progress Notes (Signed)
Remote pacemaker transmission.   

## 2021-09-05 ENCOUNTER — Ambulatory Visit (INDEPENDENT_AMBULATORY_CARE_PROVIDER_SITE_OTHER): Payer: Medicare HMO

## 2021-09-05 DIAGNOSIS — I255 Ischemic cardiomyopathy: Secondary | ICD-10-CM | POA: Diagnosis not present

## 2021-09-05 LAB — CUP PACEART REMOTE DEVICE CHECK
Battery Remaining Longevity: 70 mo
Battery Remaining Percentage: 54 %
Battery Voltage: 2.98 V
Brady Statistic AP VP Percent: 1 %
Brady Statistic AP VS Percent: 8.8 %
Brady Statistic AS VP Percent: 1 %
Brady Statistic AS VS Percent: 90 %
Brady Statistic RA Percent Paced: 8.3 %
Brady Statistic RV Percent Paced: 1 %
Date Time Interrogation Session: 20230104044424
Implantable Lead Implant Date: 20170825
Implantable Lead Implant Date: 20170825
Implantable Lead Location: 753859
Implantable Lead Location: 753860
Implantable Pulse Generator Implant Date: 20170825
Lead Channel Impedance Value: 480 Ohm
Lead Channel Impedance Value: 510 Ohm
Lead Channel Pacing Threshold Amplitude: 0.625 V
Lead Channel Pacing Threshold Amplitude: 0.875 V
Lead Channel Pacing Threshold Pulse Width: 0.5 ms
Lead Channel Pacing Threshold Pulse Width: 0.5 ms
Lead Channel Sensing Intrinsic Amplitude: 10.2 mV
Lead Channel Sensing Intrinsic Amplitude: 5 mV
Lead Channel Setting Pacing Amplitude: 0.875
Lead Channel Setting Pacing Amplitude: 1.875
Lead Channel Setting Pacing Pulse Width: 0.5 ms
Lead Channel Setting Sensing Sensitivity: 2 mV
Pulse Gen Model: 2272
Pulse Gen Serial Number: 7941497

## 2021-09-17 NOTE — Progress Notes (Signed)
Remote pacemaker transmission.   

## 2021-12-05 ENCOUNTER — Ambulatory Visit (INDEPENDENT_AMBULATORY_CARE_PROVIDER_SITE_OTHER): Payer: Medicare HMO

## 2021-12-05 DIAGNOSIS — I495 Sick sinus syndrome: Secondary | ICD-10-CM

## 2021-12-05 DIAGNOSIS — I255 Ischemic cardiomyopathy: Secondary | ICD-10-CM

## 2021-12-07 LAB — CUP PACEART REMOTE DEVICE CHECK
Battery Remaining Longevity: 67 mo
Battery Remaining Percentage: 51 %
Battery Voltage: 2.98 V
Brady Statistic AP VP Percent: 1 %
Brady Statistic AP VS Percent: 8.3 %
Brady Statistic AS VP Percent: 1 %
Brady Statistic AS VS Percent: 91 %
Brady Statistic RA Percent Paced: 7.6 %
Brady Statistic RV Percent Paced: 1 %
Date Time Interrogation Session: 20230406062520
Implantable Lead Implant Date: 20170825
Implantable Lead Implant Date: 20170825
Implantable Lead Location: 753859
Implantable Lead Location: 753860
Implantable Pulse Generator Implant Date: 20170825
Lead Channel Impedance Value: 450 Ohm
Lead Channel Impedance Value: 460 Ohm
Lead Channel Pacing Threshold Amplitude: 0.625 V
Lead Channel Pacing Threshold Amplitude: 1 V
Lead Channel Pacing Threshold Pulse Width: 0.5 ms
Lead Channel Pacing Threshold Pulse Width: 0.5 ms
Lead Channel Sensing Intrinsic Amplitude: 10.5 mV
Lead Channel Sensing Intrinsic Amplitude: 4 mV
Lead Channel Setting Pacing Amplitude: 0.875
Lead Channel Setting Pacing Amplitude: 2 V
Lead Channel Setting Pacing Pulse Width: 0.5 ms
Lead Channel Setting Sensing Sensitivity: 2 mV
Pulse Gen Model: 2272
Pulse Gen Serial Number: 7941497

## 2021-12-20 NOTE — Progress Notes (Signed)
Remote pacemaker transmission.   

## 2022-03-06 ENCOUNTER — Ambulatory Visit (INDEPENDENT_AMBULATORY_CARE_PROVIDER_SITE_OTHER): Payer: Medicare HMO

## 2022-03-06 DIAGNOSIS — I495 Sick sinus syndrome: Secondary | ICD-10-CM | POA: Diagnosis not present

## 2022-03-09 LAB — CUP PACEART REMOTE DEVICE CHECK
Battery Remaining Longevity: 64 mo
Battery Remaining Percentage: 49 %
Battery Voltage: 2.98 V
Brady Statistic AP VP Percent: 1 %
Brady Statistic AP VS Percent: 14 %
Brady Statistic AS VP Percent: 1 %
Brady Statistic AS VS Percent: 84 %
Brady Statistic RA Percent Paced: 14 %
Brady Statistic RV Percent Paced: 1 %
Date Time Interrogation Session: 20230705050646
Implantable Lead Implant Date: 20170825
Implantable Lead Implant Date: 20170825
Implantable Lead Location: 753859
Implantable Lead Location: 753860
Implantable Pulse Generator Implant Date: 20170825
Lead Channel Impedance Value: 450 Ohm
Lead Channel Impedance Value: 460 Ohm
Lead Channel Pacing Threshold Amplitude: 0.625 V
Lead Channel Pacing Threshold Amplitude: 1 V
Lead Channel Pacing Threshold Pulse Width: 0.5 ms
Lead Channel Pacing Threshold Pulse Width: 0.5 ms
Lead Channel Sensing Intrinsic Amplitude: 11.4 mV
Lead Channel Sensing Intrinsic Amplitude: 5 mV
Lead Channel Setting Pacing Amplitude: 0.875
Lead Channel Setting Pacing Amplitude: 2 V
Lead Channel Setting Pacing Pulse Width: 0.5 ms
Lead Channel Setting Sensing Sensitivity: 2 mV
Pulse Gen Model: 2272
Pulse Gen Serial Number: 7941497

## 2022-03-28 NOTE — Progress Notes (Signed)
Remote pacemaker transmission.   

## 2022-06-05 ENCOUNTER — Ambulatory Visit (INDEPENDENT_AMBULATORY_CARE_PROVIDER_SITE_OTHER): Payer: Medicare HMO

## 2022-06-05 DIAGNOSIS — I495 Sick sinus syndrome: Secondary | ICD-10-CM | POA: Diagnosis not present

## 2022-06-07 LAB — CUP PACEART REMOTE DEVICE CHECK
Battery Remaining Longevity: 61 mo
Battery Remaining Percentage: 47 %
Battery Voltage: 2.98 V
Brady Statistic AP VP Percent: 1 %
Brady Statistic AP VS Percent: 16 %
Brady Statistic AS VP Percent: 1 %
Brady Statistic AS VS Percent: 83 %
Brady Statistic RA Percent Paced: 15 %
Brady Statistic RV Percent Paced: 1 %
Date Time Interrogation Session: 20231005034927
Implantable Lead Implant Date: 20170825
Implantable Lead Implant Date: 20170825
Implantable Lead Location: 753859
Implantable Lead Location: 753860
Implantable Pulse Generator Implant Date: 20170825
Lead Channel Impedance Value: 460 Ohm
Lead Channel Impedance Value: 480 Ohm
Lead Channel Pacing Threshold Amplitude: 0.625 V
Lead Channel Pacing Threshold Amplitude: 0.875 V
Lead Channel Pacing Threshold Pulse Width: 0.5 ms
Lead Channel Pacing Threshold Pulse Width: 0.5 ms
Lead Channel Sensing Intrinsic Amplitude: 10.3 mV
Lead Channel Sensing Intrinsic Amplitude: 5 mV
Lead Channel Setting Pacing Amplitude: 0.875
Lead Channel Setting Pacing Amplitude: 1.875
Lead Channel Setting Pacing Pulse Width: 0.5 ms
Lead Channel Setting Sensing Sensitivity: 2 mV
Pulse Gen Model: 2272
Pulse Gen Serial Number: 7941497

## 2022-06-14 NOTE — Progress Notes (Signed)
Remote pacemaker transmission.   

## 2022-09-04 ENCOUNTER — Ambulatory Visit (INDEPENDENT_AMBULATORY_CARE_PROVIDER_SITE_OTHER): Payer: Medicare HMO

## 2022-09-04 DIAGNOSIS — I495 Sick sinus syndrome: Secondary | ICD-10-CM

## 2022-09-06 LAB — CUP PACEART REMOTE DEVICE CHECK
Battery Remaining Longevity: 58 mo
Battery Remaining Percentage: 45 %
Battery Voltage: 2.96 V
Brady Statistic AP VP Percent: 1 %
Brady Statistic AP VS Percent: 16 %
Brady Statistic AS VP Percent: 1 %
Brady Statistic AS VS Percent: 83 %
Brady Statistic RA Percent Paced: 15 %
Brady Statistic RV Percent Paced: 1 %
Date Time Interrogation Session: 20240105013637
Implantable Lead Connection Status: 753985
Implantable Lead Connection Status: 753985
Implantable Lead Implant Date: 20170825
Implantable Lead Implant Date: 20170825
Implantable Lead Location: 753859
Implantable Lead Location: 753860
Implantable Pulse Generator Implant Date: 20170825
Lead Channel Impedance Value: 440 Ohm
Lead Channel Impedance Value: 440 Ohm
Lead Channel Pacing Threshold Amplitude: 0.75 V
Lead Channel Pacing Threshold Amplitude: 0.875 V
Lead Channel Pacing Threshold Pulse Width: 0.5 ms
Lead Channel Pacing Threshold Pulse Width: 0.5 ms
Lead Channel Sensing Intrinsic Amplitude: 10.3 mV
Lead Channel Sensing Intrinsic Amplitude: 4 mV
Lead Channel Setting Pacing Amplitude: 1 V
Lead Channel Setting Pacing Amplitude: 1.875
Lead Channel Setting Pacing Pulse Width: 0.5 ms
Lead Channel Setting Sensing Sensitivity: 2 mV
Pulse Gen Model: 2272
Pulse Gen Serial Number: 7941497

## 2022-09-27 NOTE — Progress Notes (Signed)
Remote pacemaker transmission.   

## 2022-12-09 ENCOUNTER — Ambulatory Visit (INDEPENDENT_AMBULATORY_CARE_PROVIDER_SITE_OTHER): Payer: Medicare HMO

## 2022-12-09 DIAGNOSIS — I495 Sick sinus syndrome: Secondary | ICD-10-CM

## 2022-12-12 LAB — CUP PACEART REMOTE DEVICE CHECK
Battery Remaining Longevity: 54 mo
Battery Remaining Percentage: 42 %
Battery Voltage: 2.96 V
Brady Statistic AP VP Percent: 1 %
Brady Statistic AP VS Percent: 14 %
Brady Statistic AS VP Percent: 1 %
Brady Statistic AS VS Percent: 85 %
Brady Statistic RA Percent Paced: 14 %
Brady Statistic RV Percent Paced: 1 %
Date Time Interrogation Session: 20240411032307
Implantable Lead Connection Status: 753985
Implantable Lead Connection Status: 753985
Implantable Lead Implant Date: 20170825
Implantable Lead Implant Date: 20170825
Implantable Lead Location: 753859
Implantable Lead Location: 753860
Implantable Pulse Generator Implant Date: 20170825
Lead Channel Impedance Value: 400 Ohm
Lead Channel Impedance Value: 410 Ohm
Lead Channel Pacing Threshold Amplitude: 0.75 V
Lead Channel Pacing Threshold Amplitude: 0.875 V
Lead Channel Pacing Threshold Pulse Width: 0.5 ms
Lead Channel Pacing Threshold Pulse Width: 0.5 ms
Lead Channel Sensing Intrinsic Amplitude: 10.1 mV
Lead Channel Sensing Intrinsic Amplitude: 3.3 mV
Lead Channel Setting Pacing Amplitude: 1 V
Lead Channel Setting Pacing Amplitude: 1.875
Lead Channel Setting Pacing Pulse Width: 0.5 ms
Lead Channel Setting Sensing Sensitivity: 2 mV
Pulse Gen Model: 2272
Pulse Gen Serial Number: 7941497

## 2023-01-16 NOTE — Progress Notes (Signed)
Remote pacemaker transmission.   

## 2023-03-10 ENCOUNTER — Ambulatory Visit (INDEPENDENT_AMBULATORY_CARE_PROVIDER_SITE_OTHER): Payer: Medicare HMO

## 2023-03-10 DIAGNOSIS — I495 Sick sinus syndrome: Secondary | ICD-10-CM

## 2023-03-13 LAB — CUP PACEART REMOTE DEVICE CHECK
Battery Remaining Longevity: 51 mo
Battery Remaining Percentage: 40 %
Battery Voltage: 2.96 V
Brady Statistic AP VP Percent: 1 %
Brady Statistic AP VS Percent: 13 %
Brady Statistic AS VP Percent: 1 %
Brady Statistic AS VS Percent: 86 %
Brady Statistic RA Percent Paced: 12 %
Brady Statistic RV Percent Paced: 1 %
Date Time Interrogation Session: 20240711015026
Implantable Lead Connection Status: 753985
Implantable Lead Connection Status: 753985
Implantable Lead Implant Date: 20170825
Implantable Lead Implant Date: 20170825
Implantable Lead Location: 753859
Implantable Lead Location: 753860
Implantable Pulse Generator Implant Date: 20170825
Lead Channel Impedance Value: 440 Ohm
Lead Channel Impedance Value: 450 Ohm
Lead Channel Pacing Threshold Amplitude: 0.625 V
Lead Channel Pacing Threshold Amplitude: 0.875 V
Lead Channel Pacing Threshold Pulse Width: 0.5 ms
Lead Channel Pacing Threshold Pulse Width: 0.5 ms
Lead Channel Sensing Intrinsic Amplitude: 10.8 mV
Lead Channel Sensing Intrinsic Amplitude: 4.6 mV
Lead Channel Setting Pacing Amplitude: 0.875
Lead Channel Setting Pacing Amplitude: 1.875
Lead Channel Setting Pacing Pulse Width: 0.5 ms
Lead Channel Setting Sensing Sensitivity: 2 mV
Pulse Gen Model: 2272
Pulse Gen Serial Number: 7941497

## 2023-03-27 NOTE — Progress Notes (Signed)
Remote pacemaker transmission.   

## 2023-06-09 ENCOUNTER — Ambulatory Visit (INDEPENDENT_AMBULATORY_CARE_PROVIDER_SITE_OTHER): Payer: Medicare HMO

## 2023-06-09 DIAGNOSIS — I495 Sick sinus syndrome: Secondary | ICD-10-CM | POA: Diagnosis not present

## 2023-06-09 DIAGNOSIS — I255 Ischemic cardiomyopathy: Secondary | ICD-10-CM

## 2023-06-10 LAB — CUP PACEART REMOTE DEVICE CHECK
Battery Remaining Longevity: 49 mo
Battery Remaining Percentage: 38 %
Battery Voltage: 2.95 V
Brady Statistic AP VP Percent: 1 %
Brady Statistic AP VS Percent: 13 %
Brady Statistic AS VP Percent: 1 %
Brady Statistic AS VS Percent: 86 %
Brady Statistic RA Percent Paced: 12 %
Brady Statistic RV Percent Paced: 1 %
Date Time Interrogation Session: 20241007024720
Implantable Lead Connection Status: 753985
Implantable Lead Connection Status: 753985
Implantable Lead Implant Date: 20170825
Implantable Lead Implant Date: 20170825
Implantable Lead Location: 753859
Implantable Lead Location: 753860
Implantable Pulse Generator Implant Date: 20170825
Lead Channel Impedance Value: 460 Ohm
Lead Channel Impedance Value: 460 Ohm
Lead Channel Pacing Threshold Amplitude: 0.75 V
Lead Channel Pacing Threshold Amplitude: 0.75 V
Lead Channel Pacing Threshold Pulse Width: 0.5 ms
Lead Channel Pacing Threshold Pulse Width: 0.5 ms
Lead Channel Sensing Intrinsic Amplitude: 10.1 mV
Lead Channel Sensing Intrinsic Amplitude: 4.8 mV
Lead Channel Setting Pacing Amplitude: 1 V
Lead Channel Setting Pacing Amplitude: 1.75 V
Lead Channel Setting Pacing Pulse Width: 0.5 ms
Lead Channel Setting Sensing Sensitivity: 2 mV
Pulse Gen Model: 2272
Pulse Gen Serial Number: 7941497

## 2023-06-25 NOTE — Progress Notes (Signed)
Remote pacemaker transmission.   

## 2023-07-24 ENCOUNTER — Encounter: Payer: Self-pay | Admitting: Pulmonary Disease

## 2023-07-24 ENCOUNTER — Ambulatory Visit: Payer: Medicare HMO | Attending: Pulmonary Disease | Admitting: Pulmonary Disease

## 2023-07-24 VITALS — BP 138/80 | HR 89 | Ht 68.0 in | Wt 178.2 lb

## 2023-07-24 DIAGNOSIS — I495 Sick sinus syndrome: Secondary | ICD-10-CM

## 2023-07-24 DIAGNOSIS — I4729 Other ventricular tachycardia: Secondary | ICD-10-CM | POA: Diagnosis not present

## 2023-07-24 DIAGNOSIS — Z950 Presence of cardiac pacemaker: Secondary | ICD-10-CM

## 2023-07-24 LAB — CUP PACEART INCLINIC DEVICE CHECK
Battery Remaining Longevity: 51 mo
Battery Voltage: 2.95 V
Brady Statistic RA Percent Paced: 12 %
Brady Statistic RV Percent Paced: 0.61 %
Date Time Interrogation Session: 20241121141648
Implantable Lead Connection Status: 753985
Implantable Lead Connection Status: 753985
Implantable Lead Implant Date: 20170825
Implantable Lead Implant Date: 20170825
Implantable Lead Location: 753859
Implantable Lead Location: 753860
Implantable Pulse Generator Implant Date: 20170825
Lead Channel Impedance Value: 462.5 Ohm
Lead Channel Impedance Value: 462.5 Ohm
Lead Channel Impedance Value: 475 Ohm
Lead Channel Impedance Value: 475 Ohm
Lead Channel Pacing Threshold Amplitude: 0.75 V
Lead Channel Pacing Threshold Amplitude: 0.75 V
Lead Channel Pacing Threshold Amplitude: 0.75 V
Lead Channel Pacing Threshold Amplitude: 0.75 V
Lead Channel Pacing Threshold Amplitude: 0.75 V
Lead Channel Pacing Threshold Amplitude: 0.75 V
Lead Channel Pacing Threshold Pulse Width: 0.5 ms
Lead Channel Pacing Threshold Pulse Width: 0.5 ms
Lead Channel Pacing Threshold Pulse Width: 0.5 ms
Lead Channel Pacing Threshold Pulse Width: 0.5 ms
Lead Channel Pacing Threshold Pulse Width: 0.5 ms
Lead Channel Pacing Threshold Pulse Width: 0.5 ms
Lead Channel Sensing Intrinsic Amplitude: 11.8 mV
Lead Channel Sensing Intrinsic Amplitude: 11.8 mV
Lead Channel Sensing Intrinsic Amplitude: 5 mV
Lead Channel Sensing Intrinsic Amplitude: 5 mV
Lead Channel Setting Pacing Amplitude: 0.875
Lead Channel Setting Pacing Amplitude: 1.75 V
Lead Channel Setting Pacing Pulse Width: 0.5 ms
Lead Channel Setting Sensing Sensitivity: 2 mV
Pulse Gen Model: 2272
Pulse Gen Serial Number: 7941497

## 2023-07-24 NOTE — Progress Notes (Addendum)
Electrophysiology Office Note:   Date:  07/24/2023  ID:  Daeon Karber, DOB 1945/11/08, MRN [id number]  Primary Cardiologist: None Electrophysiologist: [name], MD       History of Present Illness:   Samuel Houston is a 77 y.o. male with h/o symptomatic bradycardia s/p PPM, mixed ICM / DCM, HTN, CAD s/p CABG, HLD (statin intolerant), Barrett's esophagus with GIB, CKD III, DM II seen today for routine electrophysiology followup.   He is followed by [name].  Unfortunately, has been lost to follow up, last seen in 12/2020.  Remote device interrotation on 06/09/23 showed stable leads/battery, 22 beats of NSVT.  Metoprolol was increased to 50 mg BID (in 06/11/23 PaceArt review).   Since last being seen in our clinic the patient reports he has been doing ok.  He notes he does have have dizziness / /lightheadedness but has been falling.  He will have periods where he doesn't fall at all and then fall a few times. This is largely related to his knees "giving out due to pain & he has a step at home leading into the bathroom that he has difficulty lifting his legs due to his knees.  Confirmed he does not have positional change dizziness. The patient reports he has been taking Metoprolol 12.5mg  BID (not prescribed 25mg  as listed or 50mg  as above).  He denies chest pain, palpitations, dyspnea, PND, orthopnea, nausea, vomiting, dizziness, syncope, edema, weight gain, or early satiety.   Review of systems complete and found to be negative unless listed in HPI.   EP Information / Studies Reviewed:    EKG is ordered today. Personal review as below.  EKG Interpretation Date/Time:  Thursday July 24 2023 13:29:58 EST Ventricular Rate:  89 PR Interval:  174 QRS Duration:  146 QT Interval:  416 QTC Calculation: 506 R Axis:   265  Text Interpretation: Normal sinus rhythm Right bundle branch block Confirmed by Canary Brim ([id number]) on 07/24/2023 2:05:21 PM   PPM  Interrogation-  reviewed in detail today,  See PACEART report.  Device History: Abbott Dual Chamber PPM implanted 04/26/2016 for Symptomatic bradycardia  Studies:  LHC 12/2019 > multivessel CAD with patent LIMA to LAD, patent SVT to diagonal, CTO to OM, occluded SVT to RCA, moderate segmental LV dysfunction, inferior akinesis > medical therapy advised  ECHO 10/2020 > LVEF 45-50%, LV inferior wall hypokinesis, LA moderately dilated, mild MR       Physical Exam:   VS:  BP 138/80   Pulse 89   Ht 5\' 8"  (1.727 m)   Wt 178 lb 3.2 oz (80.8 kg)   SpO2 95%   BMI 27.10 kg/m    Wt Readings from Last 3 Encounters:  07/24/23 178 lb 3.2 oz (80.8 kg)  12/07/20 177 lb (80.3 kg)  01/09/20 188 lb 4.4 oz (85.4 kg)     GEN: Well nourished, well developed in no acute distress NECK: No JVD; No carotid bruits CARDIAC: Regular rate and rhythm, no murmurs, rubs, gallops RESPIRATORY:  Clear to auscultation without rales, wheezing or rhonchi  ABDOMEN: Soft, non-tender, non-distended EXTREMITIES:  No edema; No deformity   ASSESSMENT AND PLAN:    Symptomatic bradycardia s/p Abbott PPM  -Normal PPM function -See Pace Art report -No changes today  NSVT  ICM / DCM -seen on remote device interrogation  -asymptomatic, no palpitations   -increase metoprolol to 25 mg BID > pt was taking 12.5 mg BID -euvolemic on exam   -one episode of NSVT  noted lasting 7 seconds, SVT   Hypertension  -well controlled on current regimen    CAD s/p CABG HLD -per primary / follows at [facility] -suspect ortho related due to knee pain  -no subjective hx of pre-syncope/syncope symptoms to suggest cardiac in nature   Disposition:   Follow up with [name] or EP APP in 12 months  Signed, Canary Brim, MSN, APRN, NP-C, AGACNP-BC [facility] - Electrophysiology  07/24/2023, 2:11 PM

## 2023-07-24 NOTE — Patient Instructions (Addendum)
Medication Instructions:  Your physician recommends that you continue on your current medications as directed. Please refer to the Current Medication list given to you today.   Please take Metoprolol tartarte 25mg  (1 tablet)  twice daily.  Lab Work: None ordered.  If you have labs (blood work) drawn today and your tests are completely normal, you will receive your results only by: MyChart Message (if you have MyChart) OR A paper copy in the mail If you have any lab test that is abnormal or we need to change your treatment, we will call you to review the results.  Testing/Procedures: None ordered.  Follow-Up: At [facility], you and your health needs are our priority.  As part of our continuing mission to provide you with exceptional heart care, we have created designated Provider Care Teams.  These Care Teams include your primary Cardiologist (physician) and Advanced Practice Providers (APPs -  Physician Assistants and Nurse Practitioners) who all work together to provide you with the care you need, when you need it.  We recommend signing up for the patient portal called "MyChart".  Sign up information is provided on this After Visit Summary.  MyChart is used to connect with patients for Virtual Visits (Telemedicine).  Patients are able to view lab/test results, encounter notes, upcoming appointments, etc.  Non-urgent messages can be sent to your provider as well.   To learn more about what you can do with MyChart, go to [email or web address].    Your next appointment:   1 year(s)  The format for your next appointment:   In Person  Provider:   Earnest Rosier, NP  Remote monitoring is used to monitor your Pacemaker/ ICD from home. This monitoring reduces the number of office visits required to check your device to one time per year. It allows [place] to keep an eye on the functioning of your device to ensure it is working properly.  Important Information About Sugar

## 2023-09-08 ENCOUNTER — Ambulatory Visit (INDEPENDENT_AMBULATORY_CARE_PROVIDER_SITE_OTHER): Payer: Medicare HMO

## 2023-09-08 DIAGNOSIS — I495 Sick sinus syndrome: Secondary | ICD-10-CM | POA: Diagnosis not present

## 2023-09-09 LAB — CUP PACEART REMOTE DEVICE CHECK
Battery Remaining Longevity: 46 mo
Battery Remaining Percentage: 35 %
Battery Voltage: 2.95 V
Brady Statistic AP VP Percent: 1 %
Brady Statistic AP VS Percent: 15 %
Brady Statistic AS VP Percent: 1 %
Brady Statistic AS VS Percent: 85 %
Brady Statistic RA Percent Paced: 15 %
Brady Statistic RV Percent Paced: 1 %
Date Time Interrogation Session: 20250106020014
Implantable Lead Connection Status: 753985
Implantable Lead Connection Status: 753985
Implantable Lead Implant Date: 20170825
Implantable Lead Implant Date: 20170825
Implantable Lead Location: 753859
Implantable Lead Location: 753860
Implantable Pulse Generator Implant Date: 20170825
Lead Channel Impedance Value: 440 Ohm
Lead Channel Impedance Value: 440 Ohm
Lead Channel Pacing Threshold Amplitude: 0.625 V
Lead Channel Pacing Threshold Amplitude: 0.75 V
Lead Channel Pacing Threshold Pulse Width: 0.5 ms
Lead Channel Pacing Threshold Pulse Width: 0.5 ms
Lead Channel Sensing Intrinsic Amplitude: 10.5 mV
Lead Channel Sensing Intrinsic Amplitude: 5 mV
Lead Channel Setting Pacing Amplitude: 0.875
Lead Channel Setting Pacing Amplitude: 1.75 V
Lead Channel Setting Pacing Pulse Width: 0.5 ms
Lead Channel Setting Sensing Sensitivity: 2 mV
Pulse Gen Model: 2272
Pulse Gen Serial Number: 7941497

## 2023-10-20 NOTE — Addendum Note (Signed)
 Addended by: Geralyn Flash D on: 10/20/2023 01:27 PM   Modules accepted: Orders

## 2023-10-20 NOTE — Progress Notes (Signed)
 Remote pacemaker transmission.

## 2023-12-08 ENCOUNTER — Ambulatory Visit (INDEPENDENT_AMBULATORY_CARE_PROVIDER_SITE_OTHER): Payer: Medicare HMO

## 2023-12-08 DIAGNOSIS — I495 Sick sinus syndrome: Secondary | ICD-10-CM

## 2023-12-09 LAB — CUP PACEART REMOTE DEVICE CHECK
Battery Remaining Longevity: 41 mo
Battery Remaining Percentage: 33 %
Battery Voltage: 2.93 V
Brady Statistic AP VP Percent: 1 %
Brady Statistic AP VS Percent: 20 %
Brady Statistic AS VP Percent: 2.4 %
Brady Statistic AS VS Percent: 77 %
Brady Statistic RA Percent Paced: 20 %
Brady Statistic RV Percent Paced: 2.8 %
Date Time Interrogation Session: 20250407041142
Implantable Lead Connection Status: 753985
Implantable Lead Connection Status: 753985
Implantable Lead Implant Date: 20170825
Implantable Lead Implant Date: 20170825
Implantable Lead Location: 753859
Implantable Lead Location: 753860
Implantable Pulse Generator Implant Date: 20170825
Lead Channel Impedance Value: 400 Ohm
Lead Channel Impedance Value: 440 Ohm
Lead Channel Pacing Threshold Amplitude: 0.75 V
Lead Channel Pacing Threshold Amplitude: 0.875 V
Lead Channel Pacing Threshold Pulse Width: 0.5 ms
Lead Channel Pacing Threshold Pulse Width: 0.5 ms
Lead Channel Sensing Intrinsic Amplitude: 3.5 mV
Lead Channel Sensing Intrinsic Amplitude: 8.3 mV
Lead Channel Setting Pacing Amplitude: 1 V
Lead Channel Setting Pacing Amplitude: 1.875
Lead Channel Setting Pacing Pulse Width: 0.5 ms
Lead Channel Setting Sensing Sensitivity: 2 mV
Pulse Gen Model: 2272
Pulse Gen Serial Number: 7941497

## 2024-01-01 DEATH — deceased

## 2024-01-28 NOTE — Addendum Note (Signed)
 Addended by: Edra Govern D on: 01/28/2024 11:04 AM   Modules accepted: Orders

## 2024-01-28 NOTE — Progress Notes (Signed)
 Remote pacemaker transmission.

## 2024-02-10 ENCOUNTER — Telehealth: Payer: Self-pay

## 2024-02-10 NOTE — Telephone Encounter (Signed)
 A letter was sent to let me know that the pt was deceased. He passed away on 2024-01-10. I canceled all upcoming appointments, Marked him deceased in [place], and took him out of [employer].
